# Patient Record
Sex: Male | Born: 1950 | Race: Black or African American | Hispanic: No | Smoking: Never smoker
Health system: Southern US, Community
[De-identification: ages and names within clinical notes are randomized; demographics above are authoritative.]

## PROBLEM LIST (undated history)

## (undated) DIAGNOSIS — T7840XA Allergy, unspecified, initial encounter: Secondary | ICD-10-CM

## (undated) DIAGNOSIS — I10 Essential (primary) hypertension: Secondary | ICD-10-CM

## (undated) DIAGNOSIS — M1990 Unspecified osteoarthritis, unspecified site: Secondary | ICD-10-CM

## (undated) HISTORY — DX: Essential (primary) hypertension: I10

## (undated) HISTORY — PX: COLONOSCOPY: SHX174

## (undated) HISTORY — DX: Allergy, unspecified, initial encounter: T78.40XA

## (undated) HISTORY — PX: TRIGGER FINGER RELEASE: SHX641

## (undated) HISTORY — PX: APPENDECTOMY: SHX54

## (undated) HISTORY — PX: COSMETIC SURGERY: SHX468

## (undated) HISTORY — PX: MINOR CARPAL TUNNEL: SHX6472

---

## 1972-10-20 HISTORY — PX: FACIAL RECONSTRUCTION SURGERY: SHX631

## 2006-12-17 ENCOUNTER — Ambulatory Visit: Payer: Self-pay | Admitting: Specialist

## 2010-11-22 ENCOUNTER — Emergency Department: Payer: Self-pay | Admitting: Emergency Medicine

## 2011-09-15 ENCOUNTER — Ambulatory Visit: Payer: Self-pay | Admitting: General Practice

## 2012-07-26 ENCOUNTER — Ambulatory Visit: Payer: Self-pay | Admitting: Gastroenterology

## 2014-04-06 ENCOUNTER — Inpatient Hospital Stay: Payer: Self-pay | Admitting: Surgery

## 2014-04-06 LAB — URINALYSIS, COMPLETE
BACTERIA: NONE SEEN
Bilirubin,UR: NEGATIVE
GLUCOSE, UR: NEGATIVE mg/dL (ref 0–75)
Hyaline Cast: 5
Leukocyte Esterase: NEGATIVE
Nitrite: NEGATIVE
Ph: 5 (ref 4.5–8.0)
SPECIFIC GRAVITY: 1.031 (ref 1.003–1.030)
Squamous Epithelial: NONE SEEN

## 2014-04-06 LAB — COMPREHENSIVE METABOLIC PANEL
AST: 34 U/L (ref 15–37)
Albumin: 4.7 g/dL (ref 3.4–5.0)
Alkaline Phosphatase: 97 U/L
Anion Gap: 7 (ref 7–16)
BILIRUBIN TOTAL: 1.7 mg/dL — AB (ref 0.2–1.0)
BUN: 19 mg/dL — ABNORMAL HIGH (ref 7–18)
CALCIUM: 9.5 mg/dL (ref 8.5–10.1)
CO2: 26 mmol/L (ref 21–32)
CREATININE: 1.37 mg/dL — AB (ref 0.60–1.30)
Chloride: 101 mmol/L (ref 98–107)
EGFR (Non-African Amer.): 55 — ABNORMAL LOW
GLUCOSE: 84 mg/dL (ref 65–99)
Osmolality: 270 (ref 275–301)
Potassium: 3.8 mmol/L (ref 3.5–5.1)
SGPT (ALT): 25 U/L (ref 12–78)
Sodium: 134 mmol/L — ABNORMAL LOW (ref 136–145)
TOTAL PROTEIN: 8.7 g/dL — AB (ref 6.4–8.2)

## 2014-04-06 LAB — CBC WITH DIFFERENTIAL/PLATELET
BASOS PCT: 0.5 %
Basophil #: 0.1 10*3/uL (ref 0.0–0.1)
Eosinophil #: 0 10*3/uL (ref 0.0–0.7)
Eosinophil %: 0.2 %
HCT: 47.7 % (ref 40.0–52.0)
HGB: 16.3 g/dL (ref 13.0–18.0)
LYMPHS ABS: 1.4 10*3/uL (ref 1.0–3.6)
Lymphocyte %: 8.4 %
MCH: 30.7 pg (ref 26.0–34.0)
MCHC: 34.1 g/dL (ref 32.0–36.0)
MCV: 90 fL (ref 80–100)
Monocyte #: 1.3 x10 3/mm — ABNORMAL HIGH (ref 0.2–1.0)
Monocyte %: 7.3 %
Neutrophil #: 14.4 10*3/uL — ABNORMAL HIGH (ref 1.4–6.5)
Neutrophil %: 83.6 %
Platelet: 244 10*3/uL (ref 150–440)
RBC: 5.31 10*6/uL (ref 4.40–5.90)
RDW: 13 % (ref 11.5–14.5)
WBC: 17.2 10*3/uL — ABNORMAL HIGH (ref 3.8–10.6)

## 2014-04-06 LAB — TROPONIN I

## 2014-04-06 LAB — LIPASE, BLOOD: LIPASE: 76 U/L (ref 73–393)

## 2014-04-10 LAB — PATHOLOGY REPORT

## 2015-02-10 NOTE — Op Note (Signed)
PATIENT NAME:  Nicholas Shah, Nicholas Shah MR#:  [id number] DATE OF BIRTH:  [birth year]  DATE OF PROCEDURE:  04/06/2014  PREOPERATIVE DIAGNOSIS: Acute appendicitis.   POSTOPERATIVE DIAGNOSIS: Acute appendicitis, gangrenous.  PROCEDURE PERFORMED:  Laparoscopic appendectomy.  ESTIMATED BLOOD LOSS: 10 mL.  COMPLICATIONS: None.   SPECIMEN: Appendix.   ANESTHESIA: General endotracheal.   INDICATION FOR SURGERY: Mr. [name] is a pleasant 64 year old who presented with right lower quadrant pain and diarrhea. Radiographic and laboratory findings consistent with appendicitis. She was brought to the operating room for laparoscopic appendectomy.   DETAILS OF PROCEDURE: Informed consent to was obtained. [name] brought to the operating room were anesthesia was induced and endotracheal tube was placed and all anesthesia was administered. His abdomen was then prepped and draped in standard surgical fashion.  A timeout was performed to make sure, during a period amount of time, patient name, operative site, and procedure to be performed. The supraumbilical incision made and it was deepened down to the fascia. The fascia inside of the perineum was entered. Two stay sutures were placed in the fasciotomy and Hassan trocar were placed in the abdomen.  The abdomen was insufflated and then two 5 mm trocars were placed in the left lower quadrant and suprapubic regions. Appendix was visualized in the right lower quadrant.  It had a large amount of fibrinous exudate and it appeared to be grossly gangrenous.  It was then grasped. A defect was placed in the mesoappendix, the base of the appendix. A stapler was placed across the base of the appendix, flush with the cecum. A stapler was then placed across the mesoappendix. The appendix was then taken out through an Endo Catch bag. The staple lines were examined and made hemostatic.  After complete of  hemostasis, all trocars were taken out under direct visualization. The  supraumbilical fascial defect was closed with figure-of-8 and 0 Vicryl, and 4-0 and 1-0 Monocryl deep dermal sutures were used to close the skin sites. Steri-Strips, Telfa pad, and Tegaderm were then used to complete the dressing. The patient was then awakened, extubated, and taken to the postanesthesia care unit. There were no immediate complications. Needle, sponge, and instrument counts were correct at the end of the procedure.     ____________________________ Si Raiderhristopher A. Lam Bjorklund, MD cal:ts D: 04/08/2014 15:04:10 ET T: 04/08/2014 19:17:04 ET JOB#: [id number]  cc: Cristal Deerhristopher A. Taron Conrey, MD, <Dictator> Nicholas NewcomerHRISTOPHER A Courtez Twaddle MD ELECTRONICALLY SIGNED 04/11/2014 11:00

## 2015-02-10 NOTE — Discharge Summary (Signed)
PATIENT NAME:  Nicholas Shah, Nicholas Shah MR#:  [id number] DATE OF BIRTH:  1951/01/01  DATE OF ADMISSION:  04/06/2014 DATE OF DISCHARGE:  04/07/2014  BRIEF HISTORY OF PRESENT ILLNESS:  Netty StarringSherman Harlin is a 64 year old gentleman seen in the Emergency Room with evidence for acute appendicitis.  He was seen by his primary care doctor for a 4 to 5 day history of abdominal pain.  Work-up suggested acute appendicitis which was confirmed on CT scan.  He is an otherwise healthy gentleman.  In this setting we elected to consider surgical intervention.  He was taken to surgery on the evening of 04/06/2014 where he underwent a laparoscopic appendectomy.  The procedure was uncomplicated without significant intraoperative problems.  Postoperatively, he was able to tolerate a regular diet within 18 hours.  This evening he is up, active and ambulating without difficulty.  He will be discharged home tonight.  He will follow in the office in 7 to 10 days' time.  Bathing, activity, and driving instructions were given to the patient.  He is to take Vicodin 5/325 by mouth for pain every 4 to 6 hours.   FINAL DISCHARGE DIAGNOSIS:  Acute appendicitis.   SURGERY:  Laparoscopic appendectomy.     ____________________________ Quentin Orealph L. Ely III, MD rle:ea D: 04/07/2014 17:56:28 ET T: 04/07/2014 23:21:01 ET JOB#: [id number]  cc: Carmie Endalph L. Ely III, MD, <Dictator> Steele SizerMark A. Crissman, MD Quentin OreALPH L ELY MD ELECTRONICALLY SIGNED 04/13/2014 16:24

## 2015-02-10 NOTE — Op Note (Signed)
PATIENT NAME:  Nicholas Shah, Saw C MR#:  [id number] DATE OF BIRTH:  07-18-1951  DATE OF PROCEDURE:  04/06/2014  PREOPERATIVE DIAGNOSIS: Acute appendicitis.   POSTOPERATIVE DIAGNOSIS: Acute appendicitis, gangrenous.  PROCEDURE PERFORMED:  Laparoscopic appendectomy.   ESTIMATED BLOOD LOSS: 10 mL.  COMPLICATIONS: None.   SPECIMEN: Appendix.   ANESTHESIA: General endotracheal.  INDICATION FOR SURGERY: [name] is a pleasant 64 year old who presents with right lower quadrant pain and diarrhea. Radiographic and laboratory findings consistent with appendicitis. She was brought to the operating room for laparoscopic appendectomy.   DETAILS OF PROCEDURE: Informed consent was obtained. [name] was brought to the operating room where anesthesia was induced.  An endotracheal tube was placed  and all anesthesia was administered .  The abdomen was then prepped and draped in standard surgical fashion.  A timeout was performed correctly identifying patient name, operative site and procedure to be performed. A supraumbilical incision was made and it was deepened down to the fascia.  The fascia at the site of the perineum was entered.  Two stay sutures were placed in the fasciotomy and Hassan trocar was placed in the abdomen.  The abdomen was insufflated and two 5 mm  trocars were placed in the left lower quadrant and suprapubic regions. The appendix was visualized in the right lower quadrant.  It had a large amount fibrinous exudate and it appeared to be grossly gangrenous.  It was then grasped. A defect was placed in the mesoappendix. In the base of the appendix, a stapler was placed across the base of the appendix flush with the cecum. A stapler was then placed across the mesoappendix. The appendix was then taken out through an Endo Catch bag. The staple lines were examined and remained hemostatic.  After completed hemostasis, all trocars were taken out under direct visualization. The supraumbilical fascial  defect was closed with a figure-of-8 and 0 Vicryl and 4-0 Monocryl deep dermal sutures were used to close the skin sites.  Steri-Strips, Telfa gauze, and Tegaderm were then used to complete the dressing. The patient was then awoken, extubated and brought to the postanesthesia care unit. There were no immediate complications. Needle, sponge, and instrument counts were correct at the end of the procedure.    ____________________________ Si Raiderhristopher A. Lundquist, MD cal:ts D: 04/08/2014 15:04:10 ET T: 04/08/2014 18:54:41 ET JOB#: [id number]  cc: Cristal Deerhristopher A. Lundquist, MD, <Dictator>  Jarvis NewcomerHRISTOPHER A LUNDQUIST MD ELECTRONICALLY SIGNED 04/11/2014 11:00

## 2016-02-15 ENCOUNTER — Encounter: Payer: Self-pay | Admitting: Physician Assistant

## 2016-02-15 ENCOUNTER — Ambulatory Visit
Admission: RE | Admit: 2016-02-15 | Discharge: 2016-02-15 | Disposition: A | Discharge status: Home / Self Care | Payer: Managed Care, Other (non HMO) | Source: Ambulatory Visit | Attending: Physician Assistant | Admitting: Physician Assistant

## 2016-02-15 ENCOUNTER — Ambulatory Visit: Payer: Self-pay | Admitting: Physician Assistant

## 2016-02-15 VITALS — BP 120/80 | HR 71 | Temp 98.4°F

## 2016-02-15 DIAGNOSIS — M1711 Unilateral primary osteoarthritis, right knee: Secondary | ICD-10-CM | POA: Insufficient documentation

## 2016-02-15 DIAGNOSIS — M25561 Pain in right knee: Secondary | ICD-10-CM

## 2016-02-15 DIAGNOSIS — M222X1 Patellofemoral disorders, right knee: Secondary | ICD-10-CM | POA: Insufficient documentation

## 2016-02-15 MED ORDER — MELOXICAM 7.5 MG PO TABS: 7.5000 mg | ORAL_TABLET | Freq: Every day | ORAL | Status: DC

## 2016-02-15 NOTE — Addendum Note (Signed)
Addended by: Joni ReiningSMITH, Yousra Ivens K on: 02/15/2016 10:26 AM   Modules accepted: Orders

## 2016-02-15 NOTE — Progress Notes (Signed)
   Subjective: Right knee pain    Patient ID: Nicholas JarvisSherman C Elford, male    DOB: 12/12/1950, 65 y.o.   MRN: [id number]  HPI Patient c/o right knee pain for 3 months. States pain after prolong sitting and gets better with ambulation. Intermitting mild edema. No provocative incident. Tibial fracture 20 years ago. No palliative measure for compliant.    Review of Systems    Negative Objective:   Physical Exam No obvious deformity or edema. Full and equal ROM. Moderate crepitus with palpation.       Assessment & Plan: Chronic right knee pain.  X-ray right knee, trial of Mobic for 10 days.  Follow up 3 days for re-evaluation.

## 2016-02-15 NOTE — Progress Notes (Signed)
Patient ID: Nicholas JarvisSherman C Shah, male   DOB: 1950-11-18, 65 y.o.   MRN: [id number] I spoke to the patient about his xray results and he expressed understanding.  He will follow-up as needed.

## 2016-02-18 ENCOUNTER — Ambulatory Visit: Payer: Self-pay | Admitting: Physician Assistant

## 2016-02-22 ENCOUNTER — Ambulatory Visit: Payer: Self-pay | Admitting: Physician Assistant

## 2016-02-22 ENCOUNTER — Encounter: Payer: Self-pay | Admitting: Physician Assistant

## 2016-02-22 VITALS — BP 150/90 | HR 79 | Temp 98.3°F

## 2016-02-22 DIAGNOSIS — M1990 Unspecified osteoarthritis, unspecified site: Secondary | ICD-10-CM

## 2016-02-22 MED ORDER — DICLOFENAC SODIUM 1 % TD GEL: 4.0000 g | Freq: Four times a day (QID) | TRANSDERMAL | Status: DC

## 2016-02-22 NOTE — Progress Notes (Signed)
S: c/o continued r knee pain, states his head is hurting and thinks its from the mobic, got so bad yesterday he had to stop on the side of the road and throw up, stopped the mobic about 3 days ago, headache has gotten a lot better this morning, noticed his bp is up, xray of r knee showed arthritis, no known injury, pain increased with movement, is affecting his golf game  O: vitals w elevated bp at 150/90; perrl eomi ENT wnl, neck supple no lymph, lungs c t a, cv rrr, r knee with crepitus on extension, tender at joint line, n/v intact, cn II-XII intact  A: arthritis, headache, elevated bp  P: stop mobic, use voltaren gel, otc tylenol arthritis, if headache continues and is worsening go to ER for eval, will refer to ortho for knee pain, recheck bp this weekend, if still elevated return to clinic on monday

## 2016-02-26 NOTE — Progress Notes (Signed)
Patient ID: Nicholas JarvisSherman C Shah, male   DOB: 01/21/1951, 65 y.o.   MRN: [id number] Patient was referred to see Altamese CabalMaurice Jones, PA-C at [facility] on 02/27/2016 at 3pm.  Patient has been notified.

## 2016-03-12 ENCOUNTER — Other Ambulatory Visit: Payer: Self-pay | Admitting: Specialist

## 2016-03-12 DIAGNOSIS — M1711 Unilateral primary osteoarthritis, right knee: Secondary | ICD-10-CM

## 2016-03-26 ENCOUNTER — Ambulatory Visit: Payer: Managed Care, Other (non HMO)

## 2016-04-07 ENCOUNTER — Other Ambulatory Visit: Payer: Self-pay | Admitting: Orthopedic Surgery

## 2016-04-10 ENCOUNTER — Encounter: Payer: Self-pay | Admitting: *Deleted

## 2016-04-10 ENCOUNTER — Other Ambulatory Visit: Payer: Self-pay

## 2016-04-10 NOTE — Patient Instructions (Signed)
  Your procedure is scheduled on: 04-17-16 [facility](THURSDAY) Report to Same Day Surgery 2nd floor medical mall To find out your arrival time please call [phone] between 1PM - 3PM on 04-16-16 [facility](WEDNESDAY)  Remember: Instructions that are not followed completely may result in serious medical risk, up to and including death, or upon the discretion of your surgeon and anesthesiologist your surgery may need to be rescheduled.    _x___ 1. Do not eat food or drink liquids after midnight. No gum chewing or hard candies.     __x__ 2. No Alcohol for 24 hours before or after surgery.   __x__3. No Smoking for 24 prior to surgery.   ____  4. Bring all medications with you on the day of surgery if instructed.    __x__ 5. Notify your doctor if there is any change in your medical condition     (cold, fever, infections).     Do not wear jewelry, make-up, hairpins, clips or nail polish.  Do not wear lotions, powders, or perfumes. You may wear deodorant.  Do not shave 48 hours prior to surgery. Men may shave face and neck.  Do not bring valuables to the hospital.    [facility] is not responsible for any belongings or valuables.               Contacts, dentures or bridgework may not be worn into surgery.  Leave your suitcase in the car. After surgery it may be brought to your room.  For patients admitted to the hospital, discharge time is determined by your treatment team.   Patients discharged the day of surgery will not be allowed to drive home.    Please read over the following fact sheets that you were given:   [facility] Preparing for Surgery and or MRSA Information   _x___ Take these medicines the morning of surgery with A SIP OF WATER:    1. NONE  2.  3.  4.  5.  6.  ____ Fleet Enema (as directed)   _x___ Use CHG Soap or sage wipes as directed on instruction sheet   ____ Use inhalers on the day of surgery and bring to hospital day of surgery  ____ Stop metformin 2 days prior to  surgery    ____ Take 1/2 of usual insulin dose the night before surgery and none on the morning of           surgery.   ____ Stop aspirin or coumadin, or plavix  _x__ Stop Anti-inflammatories such as Advil, Aleve, Ibuprofen, Motrin, Naproxen,          Naprosyn, Goodies powders or aspirin products. Ok to take Tylenol OR TRAMADOL IF NEEDED   ____ Stop supplements until after surgery.    ____ Bring C-Pap to the hospital.

## 2016-04-11 ENCOUNTER — Ambulatory Visit
Admission: RE | Admit: 2016-04-11 | Discharge: 2016-04-11 | Disposition: A | Discharge status: Home / Self Care | Payer: Managed Care, Other (non HMO) | Source: Ambulatory Visit | Attending: Orthopedic Surgery | Admitting: Orthopedic Surgery

## 2016-04-11 DIAGNOSIS — M25561 Pain in right knee: Secondary | ICD-10-CM | POA: Diagnosis present

## 2016-04-11 DIAGNOSIS — S83241D Other tear of medial meniscus, current injury, right knee, subsequent encounter: Secondary | ICD-10-CM | POA: Insufficient documentation

## 2016-04-11 DIAGNOSIS — X58XXXD Exposure to other specified factors, subsequent encounter: Secondary | ICD-10-CM | POA: Insufficient documentation

## 2016-04-11 DIAGNOSIS — Z01810 Encounter for preprocedural cardiovascular examination: Secondary | ICD-10-CM | POA: Diagnosis not present

## 2016-04-11 DIAGNOSIS — Z01812 Encounter for preprocedural laboratory examination: Secondary | ICD-10-CM | POA: Diagnosis present

## 2016-04-11 LAB — CBC WITH DIFFERENTIAL/PLATELET
BASOS PCT: 1 %
Basophils Absolute: 0 10*3/uL (ref 0–0.1)
EOS ABS: 0.2 10*3/uL (ref 0–0.7)
Eosinophils Relative: 4 %
HCT: 45.2 % (ref 40.0–52.0)
Hemoglobin: 15.3 g/dL (ref 13.0–18.0)
Lymphocytes Relative: 35 %
Lymphs Abs: 1.6 10*3/uL (ref 1.0–3.6)
MCH: 30.6 pg (ref 26.0–34.0)
MCHC: 34 g/dL (ref 32.0–36.0)
MCV: 90.1 fL (ref 80.0–100.0)
MONO ABS: 0.4 10*3/uL (ref 0.2–1.0)
MONOS PCT: 9 %
NEUTROS PCT: 51 %
Neutro Abs: 2.3 10*3/uL (ref 1.4–6.5)
Platelets: 168 10*3/uL (ref 150–440)
RBC: 5.01 MIL/uL (ref 4.40–5.90)
RDW: 13.4 % (ref 11.5–14.5)
WBC: 4.5 10*3/uL (ref 3.8–10.6)

## 2016-04-11 LAB — APTT: APTT: 29 s (ref 24–36)

## 2016-04-11 LAB — BASIC METABOLIC PANEL
Anion gap: 7 (ref 5–15)
BUN: 18 mg/dL (ref 6–20)
CALCIUM: 9.7 mg/dL (ref 8.9–10.3)
CO2: 28 mmol/L (ref 22–32)
CREATININE: 1.35 mg/dL — AB (ref 0.61–1.24)
Chloride: 105 mmol/L (ref 101–111)
GFR calc non Af Amer: 54 mL/min — ABNORMAL LOW (ref >60)
GLUCOSE: 99 mg/dL (ref 65–99)
Potassium: 4.2 mmol/L (ref 3.5–5.1)
Sodium: 140 mmol/L (ref 135–145)

## 2016-04-11 LAB — PROTIME-INR
INR: 0.99
Prothrombin Time: 13.3 seconds (ref 11.4–15.0)

## 2016-04-17 ENCOUNTER — Ambulatory Visit: Payer: Managed Care, Other (non HMO) | Admitting: Anesthesiology

## 2016-04-17 ENCOUNTER — Encounter
Admission: RE | Disposition: A | Discharge status: Home / Self Care | Payer: Self-pay | Source: Ambulatory Visit | Attending: Orthopedic Surgery

## 2016-04-17 ENCOUNTER — Ambulatory Visit
Admission: RE | Admit: 2016-04-17 | Discharge: 2016-04-17 | Disposition: A | Discharge status: Home / Self Care | Payer: Managed Care, Other (non HMO) | Source: Ambulatory Visit | Attending: Orthopedic Surgery | Admitting: Orthopedic Surgery

## 2016-04-17 ENCOUNTER — Encounter: Payer: Self-pay | Admitting: *Deleted

## 2016-04-17 DIAGNOSIS — M23221 Derangement of posterior horn of medial meniscus due to old tear or injury, right knee: Secondary | ICD-10-CM | POA: Diagnosis not present

## 2016-04-17 DIAGNOSIS — M23261 Derangement of other lateral meniscus due to old tear or injury, right knee: Secondary | ICD-10-CM | POA: Insufficient documentation

## 2016-04-17 DIAGNOSIS — Z79899 Other long term (current) drug therapy: Secondary | ICD-10-CM | POA: Diagnosis not present

## 2016-04-17 DIAGNOSIS — M1990 Unspecified osteoarthritis, unspecified site: Secondary | ICD-10-CM | POA: Diagnosis not present

## 2016-04-17 HISTORY — DX: Unspecified osteoarthritis, unspecified site: M19.90

## 2016-04-17 HISTORY — PX: KNEE ARTHROSCOPY: SHX127

## 2016-04-17 SURGERY — ARTHROSCOPY, KNEE
Anesthesia: General | Site: Knee | Laterality: Right | Wound class: Clean

## 2016-04-17 MED ORDER — GLYCOPYRROLATE 0.2 MG/ML IJ SOLN
INTRAMUSCULAR | Status: DC | PRN
  Administered 2016-04-17: 0.2 mg via INTRAVENOUS

## 2016-04-17 MED ORDER — CHLORHEXIDINE GLUCONATE 4 % EX LIQD: 60.0000 mL | Freq: Once | CUTANEOUS | Status: DC

## 2016-04-17 MED ORDER — PROPOFOL 10 MG/ML IV BOLUS
INTRAVENOUS | Status: DC | PRN
  Administered 2016-04-17: 150 mg via INTRAVENOUS

## 2016-04-17 MED ORDER — HYDROMORPHONE HCL 1 MG/ML IJ SOLN
0.5000 mg | INTRAMUSCULAR | Status: DC | PRN
  Administered 2016-04-17 (×2): 0.5 mg via INTRAVENOUS

## 2016-04-17 MED ORDER — LACTATED RINGERS IV SOLN
INTRAVENOUS | Status: DC | PRN
  Administered 2016-04-17: 11:00:00 via INTRAVENOUS

## 2016-04-17 MED ORDER — PHENYLEPHRINE HCL 10 MG/ML IJ SOLN
INTRAMUSCULAR | Status: DC | PRN
  Administered 2016-04-17: 200 ug via INTRAVENOUS
  Administered 2016-04-17 (×6): 100 ug via INTRAVENOUS
  Administered 2016-04-17: 200 ug via INTRAVENOUS

## 2016-04-17 MED ORDER — HYDROCODONE-ACETAMINOPHEN 5-325 MG PO TABS
1.0000 | ORAL_TABLET | Freq: Four times a day (QID) | ORAL | Status: DC | PRN
  Administered 2016-04-17: 1 via ORAL

## 2016-04-17 MED ORDER — BUPIVACAINE-EPINEPHRINE 0.25% -1:200000 IJ SOLN
INTRAMUSCULAR | Status: DC | PRN
  Administered 2016-04-17: 30 mL

## 2016-04-17 MED ORDER — FAMOTIDINE 20 MG PO TABS
ORAL_TABLET | ORAL | Status: AC
  Filled 2016-04-17: qty 1

## 2016-04-17 MED ORDER — ONDANSETRON HCL 4 MG PO TABS: 4.0000 mg | ORAL_TABLET | Freq: Three times a day (TID) | ORAL | Status: DC | PRN

## 2016-04-17 MED ORDER — CEFAZOLIN SODIUM-DEXTROSE 2-4 GM/100ML-% IV SOLN
INTRAVENOUS | Status: AC
  Filled 2016-04-17: qty 100

## 2016-04-17 MED ORDER — EPHEDRINE SULFATE 50 MG/ML IJ SOLN
INTRAMUSCULAR | Status: DC | PRN
  Administered 2016-04-17: 10 mg via INTRAVENOUS

## 2016-04-17 MED ORDER — HYDROMORPHONE HCL 1 MG/ML IJ SOLN
INTRAMUSCULAR | Status: AC
  Administered 2016-04-17: 0.5 mg via INTRAVENOUS
  Filled 2016-04-17: qty 1

## 2016-04-17 MED ORDER — MIDAZOLAM HCL 2 MG/2ML IJ SOLN
INTRAMUSCULAR | Status: DC | PRN
  Administered 2016-04-17: 2 mg via INTRAVENOUS

## 2016-04-17 MED ORDER — BUPIVACAINE HCL (PF) 0.25 % IJ SOLN
INTRAMUSCULAR | Status: AC
  Filled 2016-04-17: qty 30

## 2016-04-17 MED ORDER — ONDANSETRON HCL 4 MG/2ML IJ SOLN
INTRAMUSCULAR | Status: DC | PRN
  Administered 2016-04-17: 4 mg via INTRAVENOUS

## 2016-04-17 MED ORDER — FAMOTIDINE 20 MG PO TABS
20.0000 mg | ORAL_TABLET | Freq: Once | ORAL | Status: AC
  Administered 2016-04-17: 20 mg via ORAL

## 2016-04-17 MED ORDER — LIDOCAINE HCL (CARDIAC) 20 MG/ML IV SOLN
INTRAVENOUS | Status: DC | PRN
  Administered 2016-04-17: 80 mg via INTRAVENOUS

## 2016-04-17 MED ORDER — CEFAZOLIN SODIUM-DEXTROSE 2-4 GM/100ML-% IV SOLN
2.0000 g | INTRAVENOUS | Status: AC
  Administered 2016-04-17: 2 g via INTRAVENOUS

## 2016-04-17 MED ORDER — FENTANYL CITRATE (PF) 100 MCG/2ML IJ SOLN
INTRAMUSCULAR | Status: DC | PRN
  Administered 2016-04-17 (×3): 50 ug via INTRAVENOUS

## 2016-04-17 MED ORDER — LIDOCAINE HCL 1 % IJ SOLN
INTRAMUSCULAR | Status: DC | PRN
  Administered 2016-04-17: 10 mL

## 2016-04-17 MED ORDER — ONDANSETRON HCL 4 MG/2ML IJ SOLN: 4.0000 mg | Freq: Once | INTRAMUSCULAR | Status: DC | PRN

## 2016-04-17 MED ORDER — LIDOCAINE HCL (PF) 1 % IJ SOLN
INTRAMUSCULAR | Status: AC
Start: 2016-04-17 — End: 2016-04-17
  Filled 2016-04-17: qty 30

## 2016-04-17 MED ORDER — FENTANYL CITRATE (PF) 100 MCG/2ML IJ SOLN
25.0000 ug | INTRAMUSCULAR | Status: DC | PRN
  Administered 2016-04-17 (×4): 25 ug via INTRAVENOUS

## 2016-04-17 MED ORDER — HYDROCODONE-ACETAMINOPHEN 5-325 MG PO TABS: 1.0000 | ORAL_TABLET | Freq: Four times a day (QID) | ORAL | Status: DC | PRN

## 2016-04-17 MED ORDER — ASPIRIN EC 325 MG PO TBEC: 325.0000 mg | DELAYED_RELEASE_TABLET | Freq: Every day | ORAL | Status: DC

## 2016-04-17 MED ORDER — LACTATED RINGERS IV SOLN
INTRAVENOUS | Status: DC
  Administered 2016-04-17: 11:00:00 via INTRAVENOUS

## 2016-04-17 MED ORDER — HYDROCODONE-ACETAMINOPHEN 5-325 MG PO TABS
ORAL_TABLET | ORAL | Status: AC
  Filled 2016-04-17: qty 1

## 2016-04-17 MED ORDER — FENTANYL CITRATE (PF) 100 MCG/2ML IJ SOLN
INTRAMUSCULAR | Status: AC
  Administered 2016-04-17: 25 ug via INTRAVENOUS
  Filled 2016-04-17: qty 2

## 2016-04-17 SURGICAL SUPPLY — 34 items
BUR RADIUS 3.5 (BURR) ×2 IMPLANT
BUR RADIUS 4.0X18.5 (BURR) ×2 IMPLANT
COOLER POLAR GLACIER W/PUMP (MISCELLANEOUS) IMPLANT
CUFF TOURN 24 STER (MISCELLANEOUS) ×2 IMPLANT
CUFF TOURN 30 STER DUAL PORT (MISCELLANEOUS) IMPLANT
DRAPE IMP U-DRAPE 54X76 (DRAPES) ×2 IMPLANT
DURAPREP 26ML APPLICATOR (WOUND CARE) ×6 IMPLANT
GAUZE PETRO XEROFOAM 1X8 (MISCELLANEOUS) ×2 IMPLANT
GAUZE SPONGE 4X4 12PLY STRL (GAUZE/BANDAGES/DRESSINGS) ×2 IMPLANT
GLOVE BIOGEL PI IND STRL 9 (GLOVE) ×1 IMPLANT
GLOVE BIOGEL PI INDICATOR 9 (GLOVE) ×1
GLOVE SURG 9.0 ORTHO LTXF (GLOVE) ×4 IMPLANT
GOWN STRL REUS TWL 2XL XL LVL4 (GOWN DISPOSABLE) ×2 IMPLANT
GOWN STRL REUS W/ TWL LRG LVL3 (GOWN DISPOSABLE) ×1 IMPLANT
GOWN STRL REUS W/TWL LRG LVL3 (GOWN DISPOSABLE) ×1
IV LACTATED RINGER IRRG 3000ML (IV SOLUTION) ×4
IV LR IRRIG 3000ML ARTHROMATIC (IV SOLUTION) ×4 IMPLANT
KIT RM TURNOVER STRD PROC AR (KITS) ×2 IMPLANT
MANIFOLD NEPTUNE II (INSTRUMENTS) ×2 IMPLANT
PACK ARTHROSCOPY KNEE (MISCELLANEOUS) ×2 IMPLANT
PAD ABD DERMACEA PRESS 5X9 (GAUZE/BANDAGES/DRESSINGS) ×4 IMPLANT
PAD WRAPON POLAR KNEE (MISCELLANEOUS) ×1 IMPLANT
SET TUBE SUCT SHAVER OUTFL 24K (TUBING) ×2 IMPLANT
SET TUBE TIP INTRA-ARTICULAR (MISCELLANEOUS) ×2 IMPLANT
SOL PREP PVP 2OZ (MISCELLANEOUS)
SOLUTION PREP PVP 2OZ (MISCELLANEOUS) IMPLANT
STRIP CLOSURE SKIN 1/2X4 (GAUZE/BANDAGES/DRESSINGS) ×2 IMPLANT
SUT ETHILON 4-0 (SUTURE) ×1
SUT ETHILON 4-0 FS2 18XMFL BLK (SUTURE) ×1
SUTURE ETHLN 4-0 FS2 18XMF BLK (SUTURE) ×1 IMPLANT
TUBING ARTHRO INFLOW-ONLY STRL (TUBING) ×2 IMPLANT
WAND HAND CNTRL MULTIVAC 50 (MISCELLANEOUS) IMPLANT
WAND HAND CNTRL MULTIVAC 90 (MISCELLANEOUS) ×2 IMPLANT
WRAPON POLAR PAD KNEE (MISCELLANEOUS) ×2

## 2016-04-17 NOTE — Op Note (Signed)
PATIENT:  Nicholas Shah  PRE-OPERATIVE DIAGNOSIS:  TEAR OF MEDIAL MENISCUS  POST-OPERATIVE DIAGNOSIS:  Same  PROCEDURE:  KNEE ARTHROSCOPY WITH  Partial MEDIAL and LATERAL MENISECTOMY, synovectomy, chondroplasty of the femoral trochlea  SURGEON:  Thornton Park, MD  ANESTHESIA:   General  PREOPERATIVE INDICATIONS:  Nicholas Shah  65 y.o. male with a diagnosis of TEARS OF MEDIAL AND LATERAL MENISCUS who failed conservative management and elected for surgical management.  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  The risks benefits and alternatives were discussed with the patient preoperatively including the risks of infection, bleeding, nerve injury, knee stiffness, persistent pain, osteoarthritis and the need for further surgery. Medical  risks include DVT and pulmonary embolism, myocardial infarction, stroke, pneumonia, respiratory failure and death. The patient understood these risks and wished to proceed.   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  OPERATIVE PROCEDURE: Patient was met in the preoperative area. The right extremity was signed with the word yes and my initials according the hospital's correct site of surgery protocol.  The patient was brought to the operating room where he was placed supine on the operative table. General anesthesia was administered. The patient was prepped and draped in a sterile fashion.  A timeout was performed to verify the patient's name, date of birth, medical record number, correct site of surgery correct procedure to be performed. It was also used to verify the patient had had received antibiotics that all appropriate instruments, and radiographic studies were  available in the room. Once all in attendance were in agreement, the case began.  Proposed arthroscopy incisions were drawn out with a surgical marker. These were pre-injected with 1% lidocaine plain. An 11 blade was used to establish an inferior lateral and inferomedial portals. The inferomedial portal was created using a 18-gauge spinal needle under direct visualization.  A full diagnostic examination of the knee was performed including the suprapatellar pouch, patellofemoral joint, medial lateral compartments as well as the medial lateral gutters, the intercondylar notch in the posterior knee.  A partial synovectomy was performed using a 4.0 resector shaver blade and 90 ArthroCare wand from the anterior knee and suprapatella pouch and Hoffa's fat pad debrided. Patient had a tear of the posterior horn of the medial meniscus. The meniscal tear treated with a 4.0 and 3.5 resector shaver blades and straight and upward angulated duckbill biters. The meniscus was debrided until a stable rim was achieved. A chondroplasty of the femoral trochlea was also performed using a 4-0 resector shaver blade.  The patient's leg was then placed in a figure 4 position. Degenerative tears of the lateral meniscus were seen along the inner margin and debrided with a straight duckbill biter and 4.0 resector shaver blade.  The knee was then copiously lavaged. All arthroscopic incisions removed. The 2 arthroscopy portals were closed with 4-0 nylon. Steri-Strips were applied along with a dry sterile and compressive dressing. The patient was brought to the PACU in stable condition. I scrubbed and present the entire case and all sharp and instrument counts were correct at the conclusion the case. I spoke to the patient's family postoperatively to let them know the case was performed without complication and the patient was stable in the recovery room.   Nicholas Shah  L. Mack Guise, MD

## 2016-04-17 NOTE — Anesthesia Procedure Notes (Signed)
Procedure Name: LMA Insertion Performed by: Casey BurkittHOANG, Calob Baskette Pre-anesthesia Checklist: Patient identified, Timeout performed, Emergency Drugs available, Suction available and Patient being monitored Patient Re-evaluated:Patient Re-evaluated prior to inductionOxygen Delivery Method: Circle system utilized Preoxygenation: Pre-oxygenation with 100% oxygen Intubation Type: IV induction LMA: LMA inserted LMA Size: 4.0 Number of attempts: 1 Tube secured with: Tape Dental Injury: Teeth and Oropharynx as per pre-operative assessment

## 2016-04-17 NOTE — H&P (Signed)
The patient has been re-examined, and the chart reviewed, and there have been no interval changes to the documented history and physical.    The risks, benefits, and alternatives have been discussed at length, and the patient is willing to proceed.   

## 2016-04-17 NOTE — H&P (Signed)
PREOPERATIVE H&P  Chief Complaint: [id number]: Peripheral tear of medial meniscus  HPI: Nicholas Shah is a 65 y.o. male who presents for preoperative history and physical with a diagnosis of medial and possible lateral meniscus tears. Symptoms are rated as moderate to severe, and have been worsening.  This is significantly impairing activities of daily living including ambulation.  He has elected for surgical management.   Past Medical History  Diagnosis Date  . Arthritis    Past Surgical History  Procedure Laterality Date  . Appendectomy    . Minor carpal tunnel    . Trigger finger release    . Colonoscopy     Social History   Social History  . Marital Status: Married    Spouse Name: N/A  . Number of Children: N/A  . Years of Education: N/A   Social History Main Topics  . Smoking status: Never Smoker   . Smokeless tobacco: None  . Alcohol Use: No  . Drug Use: No  . Sexual Activity: Not Asked   Other Topics Concern  . None   Social History Narrative   History reviewed. No pertinent family history. No Known Allergies Prior to Admission medications   Medication Sig Start Date End Date Taking? Authorizing Provider  traMADol (ULTRAM) 50 MG tablet Take 50 mg by mouth every 6 (six) hours as needed.   Yes Historical Provider, MD  diclofenac sodium (VOLTAREN) 1 % GEL Apply 4 g topically 4 (four) times daily. Patient not taking: Reported on 04/03/2016 02/22/16   Faythe GheeSusan W Fisher, PA-C     Positive ROS: All other systems have been reviewed and were otherwise negative with the exception of those mentioned in the HPI and as above.  Physical Exam: General: Alert, no acute distress Cardiovascular: Regular rate and rhythm, no murmurs rubs or gallops.  No pedal edema Respiratory: Clear to auscultation bilaterally, no wheezes rales or rhonchi. No cyanosis, no use of accessory musculature GI: No organomegaly, abdomen is soft and non-tender nondistended with positive bowel  sounds. Skin: Skin intact, no lesions within the operative field. Neurologic: Sensation intact distally Psychiatric: Patient is competent for consent with normal mood and affect Lymphatic: No cervical lymphadenopathy  MUSCULOSKELETAL: Right knee:  Patient's skin is intact. There is no erythema or ecchymosis or effusion. His range of motion is from 0-130. He had tenderness over the medial greater than lateral joint lines. There is no ligamentous laxity. Hip pain with McMurray's testing. Patient 5 out of 5 strength in all muscle groups, intact sensation light touch in palpable pedal pulses. He has patellofemoral crepitus but no grind or apprehension.  Assessment: Right knee medial meniscus and possible lateral meniscus tears  Plan: Plan for Procedure(s): RIGHT KNEE ARTHROSCOPIC PARTIAL MEDIAL AND POSSIBLE LATERAL MENISCECTOMIES  I discussed the details of the operation as well as postoperative course with the patient in my office prior to the date of surgery.  I also discussed the risks and benefits of surgery. The risks include but are not limited to infection, bleeding requiring blood transfusion, nerve or blood vessel injury, joint stiffness or loss of motion, persistent pain, weakness or instability, malunion, nonunion and hardware failure and the need for further surgery. Medical risks include but are not limited to DVT and pulmonary embolism, myocardial infarction, stroke, pneumonia, respiratory failure and death. Patient understood these risks and wished to proceed. I answered all questions by the patient.  Juanell FairlyKRASINSKI, Erby Sanderson, MD   04/17/2016 7:34 AM

## 2016-04-17 NOTE — Transfer of Care (Signed)
Immediate Anesthesia Transfer of Care Note  Patient: Nicholas Shah  Procedure(s) Performed: Procedure(s): ARTHROSCOPY KNEE, MEDIAL AND LATERAL PARTIAL MENISECTOMIES (Right)  Patient Location: PACU  Anesthesia Type:General  Level of Consciousness: sedated and responds to stimulation  Airway & Oxygen Therapy: Patient Spontanous Breathing and Patient connected to face mask oxygen  Post-op Assessment: Report given to RN and Post -op Vital signs reviewed and stable  Post vital signs: Reviewed and stable  Last Vitals:  Filed Vitals:   04/17/16 1033 04/17/16 1221  BP: 155/96 145/95  Pulse: 71 85  Temp: 36.7 C 36.2 C  Resp: 16 16    Last Pain:  Filed Vitals:   04/17/16 1221  PainSc: 2          Complications: No apparent anesthesia complications

## 2016-04-17 NOTE — Anesthesia Postprocedure Evaluation (Signed)
Anesthesia Post Note  Patient: Nicholas JarvisSherman C Koppel  Procedure(s) Performed: Procedure(s) (LRB): ARTHROSCOPY KNEE, MEDIAL AND LATERAL PARTIAL MENISECTOMIES (Right)  Patient location during evaluation: PACU Anesthesia Type: General Level of consciousness: awake and alert Pain management: pain level controlled Vital Signs Assessment: post-procedure vital signs reviewed and stable Respiratory status: spontaneous breathing and respiratory function stable Cardiovascular status: stable Anesthetic complications: no    Last Vitals:  Filed Vitals:   04/17/16 1341 04/17/16 1358  BP: 140/86 164/85  Pulse: 95 85  Temp: 36.6 C   Resp: 14 16    Last Pain:  Filed Vitals:   04/17/16 1400  PainSc: 2                  Orange Hilligoss K

## 2016-04-17 NOTE — Anesthesia Preprocedure Evaluation (Signed)
Anesthesia Evaluation  Patient identified by MRN, date of birth, ID band Patient awake    Reviewed: Allergy & Precautions, NPO status , Patient's Chart, lab work & pertinent test results  History of Anesthesia Complications Negative for: history of anesthetic complications  Airway Mallampati: II       Dental  (+) Partial Upper   Pulmonary neg pulmonary ROS,           Cardiovascular negative cardio ROS       Neuro/Psych negative neurological ROS     GI/Hepatic negative GI ROS, Neg liver ROS,   Endo/Other  negative endocrine ROS  Renal/GU negative Renal ROS     Musculoskeletal  (+) Arthritis , Osteoarthritis,    Abdominal   Peds  Hematology negative hematology ROS (+)   Anesthesia Other Findings   Reproductive/Obstetrics                             Anesthesia Physical Anesthesia Plan  ASA: II  Anesthesia Plan: General   Post-op Pain Management:    Induction:   Airway Management Planned:   Additional Equipment:   Intra-op Plan:   Post-operative Plan:   Informed Consent: I have reviewed the patients History and Physical, chart, labs and discussed the procedure including the risks, benefits and alternatives for the proposed anesthesia with the patient or authorized representative who has indicated his/her understanding and acceptance.     Plan Discussed with:   Anesthesia Plan Comments:         Anesthesia Quick Evaluation

## 2016-04-17 NOTE — Discharge Instructions (Signed)

## 2016-06-03 ENCOUNTER — Ambulatory Visit: Payer: Self-pay | Admitting: Physician Assistant

## 2016-06-03 VITALS — BP 135/85 | HR 90 | Temp 98.3°F

## 2016-06-03 DIAGNOSIS — H1013 Acute atopic conjunctivitis, bilateral: Secondary | ICD-10-CM

## 2016-06-03 MED ORDER — OLOPATADINE HCL 0.1 % OP SOLN: 1.0000 [drp] | Freq: Two times a day (BID) | OPHTHALMIC | 12 refills | Status: DC

## 2016-06-03 NOTE — Progress Notes (Signed)
S: c/o eyes being matted in the mornings for last 3 months, no cold sx, no drainage or itching during the day, no fever/chills  O: vitals wnl, nad, perrl eomi, conjunctiva wnl, neck supple no lymph, lungsc  t a, cv rrr  A: allergic conjunctivitis  P: patanol opth gtts, lubricating drops prior to bed, return if not better in 1 week

## 2017-01-16 ENCOUNTER — Encounter (INDEPENDENT_AMBULATORY_CARE_PROVIDER_SITE_OTHER): Payer: Self-pay

## 2017-01-16 ENCOUNTER — Encounter: Payer: Self-pay | Admitting: Physician Assistant

## 2017-01-16 ENCOUNTER — Ambulatory Visit: Payer: Self-pay | Admitting: Physician Assistant

## 2017-01-16 VITALS — BP 130/80 | HR 89 | Temp 98.5°F | Ht 67.0 in | Wt 172.0 lb

## 2017-01-16 DIAGNOSIS — Z0000 Encounter for general adult medical examination without abnormal findings: Secondary | ICD-10-CM

## 2017-01-16 DIAGNOSIS — Z0189 Encounter for other specified special examinations: Secondary | ICD-10-CM

## 2017-01-16 DIAGNOSIS — Z008 Encounter for other general examination: Secondary | ICD-10-CM

## 2017-01-16 NOTE — Progress Notes (Signed)
S: pt here for wellness physical and biometrics for insurance purposes, no complaints ros neg. PMH:   High cholesterol treated with diet; knee surgery,  Social: nonsmoker, no etoh, no drugs  Fam:  CAD on father's side, some DM, most died of old age  O: vitals wnl, nad, ENT wnl, neck supple no lymph, lungs c t a, cv rrr, abd soft nontender bs normal all 4 quads  A: wellness, biometric physical  P: labs today, f/u prn

## 2017-01-22 LAB — CMP12+LP+TP+TSH+6AC+PSA+CBC…
ALBUMIN: 4.5 g/dL (ref 3.6–4.8)
ALT: 16 IU/L (ref 0–44)
AST: 12 IU/L (ref 0–40)
Albumin/Globulin Ratio: 1.8 (ref 1.2–2.2)
Alkaline Phosphatase: 73 IU/L (ref 39–117)
BASOS: 3 %
BUN/Creatinine Ratio: 13 (ref 10–24)
BUN: 19 mg/dL (ref 8–27)
Basophils Absolute: 0.2 10*3/uL (ref 0.0–0.2)
Bilirubin Total: 0.8 mg/dL (ref 0.0–1.2)
CALCIUM: 9.9 mg/dL (ref 8.6–10.2)
CHLORIDE: 103 mmol/L (ref 96–106)
CHOL/HDL RATIO: 4 ratio (ref 0.0–5.0)
Cholesterol, Total: 260 mg/dL — ABNORMAL HIGH (ref 100–199)
Creatinine, Ser: 1.43 mg/dL — ABNORMAL HIGH (ref 0.76–1.27)
EOS (ABSOLUTE): 0.5 10*3/uL — ABNORMAL HIGH (ref 0.0–0.4)
EOS: 8 %
Estimated CHD Risk: 0.7 times avg. (ref 0.0–1.0)
Free Thyroxine Index: 1.8 (ref 1.2–4.9)
GFR calc non Af Amer: 51 mL/min/{1.73_m2} — ABNORMAL LOW (ref >59)
GFR, EST AFRICAN AMERICAN: 59 mL/min/{1.73_m2} — AB (ref >59)
GGT: 27 IU/L (ref 0–65)
Globulin, Total: 2.5 g/dL (ref 1.5–4.5)
Glucose: 153 mg/dL — ABNORMAL HIGH (ref 65–99)
HDL: 65 mg/dL (ref >39)
HEMATOCRIT: 46.3 % (ref 37.5–51.0)
HEMOGLOBIN: 15.8 g/dL (ref 13.0–17.7)
Iron: 133 ug/dL (ref 38–169)
LDH: 171 IU/L (ref 121–224)
LDL CALC: 167 mg/dL — AB (ref 0–99)
Lymphocytes Absolute: 3.5 10*3/uL — ABNORMAL HIGH (ref 0.7–3.1)
Lymphs: 60 %
MCH: 30.3 pg (ref 26.6–33.0)
MCHC: 34.1 g/dL (ref 31.5–35.7)
MCV: 89 fL (ref 79–97)
MONOCYTES: 11 %
Monocytes Absolute: 0.6 10*3/uL (ref 0.1–0.9)
NEUTROS PCT: 18 %
Neutrophils Absolute: 1.1 10*3/uL — ABNORMAL LOW (ref 1.4–7.0)
POTASSIUM: 5 mmol/L (ref 3.5–5.2)
PROSTATE SPECIFIC AG, SERUM: 4.8 ng/mL — AB (ref 0.0–4.0)
Phosphorus: 2.9 mg/dL (ref 2.5–4.5)
Platelets: 210 10*3/uL (ref 150–379)
RBC: 5.22 x10E6/uL (ref 4.14–5.80)
RDW: 13.8 % (ref 12.3–15.4)
Sodium: 144 mmol/L (ref 134–144)
T3 Uptake Ratio: 27 % (ref 24–39)
T4, Total: 6.8 ug/dL (ref 4.5–12.0)
TOTAL PROTEIN: 7 g/dL (ref 6.0–8.5)
TRIGLYCERIDES: 140 mg/dL (ref 0–149)
TSH: 0.846 u[IU]/mL (ref 0.450–4.500)
Uric Acid: 6.7 mg/dL (ref 3.7–8.6)
VLDL CHOLESTEROL CAL: 28 mg/dL (ref 5–40)
WBC: 5.9 10*3/uL (ref 3.4–10.8)

## 2017-01-22 LAB — VITAMIN D 25 HYDROXY (VIT D DEFICIENCY, FRACTURES): Vit D, 25-Hydroxy: 8.5 ng/mL — ABNORMAL LOW (ref 30.0–100.0)

## 2017-01-22 LAB — HIV ANTIBODY (ROUTINE TESTING W REFLEX): HIV Screen 4th Generation wRfx: NONREACTIVE

## 2017-01-22 LAB — HCV COMMENT:

## 2017-01-22 LAB — HEPATITIS C ANTIBODY (REFLEX): HCV Ab: <0.1 s/co ratio (ref 0.0–0.9)

## 2017-01-26 ENCOUNTER — Other Ambulatory Visit: Payer: Self-pay | Admitting: Physician Assistant

## 2017-01-26 MED ORDER — VITAMIN D (ERGOCALCIFEROL) 1.25 MG (50000 UNIT) PO CAPS: 50000.0000 [IU] | ORAL_CAPSULE | ORAL | 3 refills | Status: DC

## 2017-01-26 NOTE — Progress Notes (Signed)
vit

## 2017-02-13 LAB — HGB A1C W/O EAG: HEMOGLOBIN A1C: 5.6 % (ref 4.8–5.6)

## 2017-02-13 LAB — SPECIMEN STATUS REPORT

## 2017-12-04 ENCOUNTER — Ambulatory Visit: Payer: Self-pay | Admitting: Physician Assistant

## 2017-12-04 ENCOUNTER — Encounter: Payer: Self-pay | Admitting: Physician Assistant

## 2017-12-04 VITALS — BP 128/78 | HR 82 | Temp 98.0°F | Ht 68.0 in | Wt 169.0 lb

## 2017-12-04 DIAGNOSIS — Z299 Encounter for prophylactic measures, unspecified: Secondary | ICD-10-CM

## 2017-12-04 MED ORDER — PSEUDOEPH-BROMPHEN-DM 30-2-10 MG/5ML PO SYRP: 5.0000 mL | ORAL_SOLUTION | Freq: Four times a day (QID) | ORAL | 0 refills | Status: DC | PRN

## 2017-12-04 NOTE — Progress Notes (Addendum)
Was seen by [name] on 12/04/17  For some reason, the prescription for Brompheniramine-pseudoephedrine-DM syrup,( for occasional prn use) by [name], PA-C was not sent electronically.  Patient called back today regarding this, and I'm going to electronically [facility] [address]  [name] M.D. 12/07/2017   Disp Refills Start End   brompheniramine-pseudoephedrine-DM 30-2-10 MG/5ML syrup 120 mL 0 12/07/2017    Sig - Route: Take 5 mLs by mouth 3 (three) times daily as needed. - Oral   Sent to pharmacy as: brompheniramine-pseudoephedrine-DM 30-2-10 MG/5ML syrup   E-Prescribing Status: Receipt confirmed by pharmacy (12/07/2017 12:43 PM EST)      Subjective: Annual exam/URI.    Patient ID: Nicholas Shah, male    DOB: [birth year], 67 y.o.   MRN: [id number]  HPI Patient reports today with annual exam.  Patient was URI signs and symptoms.  No medications.   Review of Systems    Unremarkable except for complaint Objective:   Physical Exam HEENT remarkable for clear rhinorrhea and postnasal drainage.  Pharynx was nonerythematous.  Neck was supple without adenopathy.  Lungs are clear to auscultation heart is regular rate and rhythm.  Examination upper lower extremity shows no obvious deformity.  Patient full equal range of motion of upper and lower extremities.  Examination of the cervical and lumbar spine shows no deformity.  Patient had full neck range of motion cervical lumbar spine.  Cranial nerves II through XII grossly intact.       Assessment & Plan: Well exam with viral URI.  Patient will follow-up post labs.  Patient given a prescription for Bromfed-DM.

## 2017-12-05 LAB — CMP12+LP+TP+TSH+6AC+PSA+CBC…
A/G RATIO: 1.6 (ref 1.2–2.2)
ALBUMIN: 4.4 g/dL (ref 3.6–4.8)
ALK PHOS: 69 IU/L (ref 39–117)
ALT: 16 IU/L (ref 0–44)
AST: 18 IU/L (ref 0–40)
BASOS ABS: 0 10*3/uL (ref 0.0–0.2)
BILIRUBIN TOTAL: 0.7 mg/dL (ref 0.0–1.2)
BUN/Creatinine Ratio: 12 (ref 10–24)
BUN: 18 mg/dL (ref 8–27)
Basos: 1 %
CALCIUM: 9.7 mg/dL (ref 8.6–10.2)
CHOL/HDL RATIO: 3.1 ratio (ref 0.0–5.0)
CHOLESTEROL TOTAL: 226 mg/dL — AB (ref 100–199)
Chloride: 104 mmol/L (ref 96–106)
Creatinine, Ser: 1.47 mg/dL — ABNORMAL HIGH (ref 0.76–1.27)
EOS (ABSOLUTE): 0.4 10*3/uL (ref 0.0–0.4)
EOS: 6 %
Estimated CHD Risk: <0.5 times avg. (ref 0.0–1.0)
FREE THYROXINE INDEX: 1.7 (ref 1.2–4.9)
GFR calc Af Amer: 57 mL/min/{1.73_m2} — ABNORMAL LOW (ref >59)
GFR calc non Af Amer: 49 mL/min/{1.73_m2} — ABNORMAL LOW (ref >59)
GGT: 16 IU/L (ref 0–65)
Globulin, Total: 2.8 g/dL (ref 1.5–4.5)
Glucose: 80 mg/dL (ref 65–99)
HDL: 74 mg/dL (ref >39)
HEMOGLOBIN: 14.8 g/dL (ref 13.0–17.7)
Hematocrit: 44.1 % (ref 37.5–51.0)
IMMATURE GRANS (ABS): 0 10*3/uL (ref 0.0–0.1)
IMMATURE GRANULOCYTES: 0 %
Iron: 31 ug/dL — ABNORMAL LOW (ref 38–169)
LDH: 172 IU/L (ref 121–224)
LDL CALC: 136 mg/dL — AB (ref 0–99)
LYMPHS ABS: 1.4 10*3/uL (ref 0.7–3.1)
LYMPHS: 21 %
MCH: 30.8 pg (ref 26.6–33.0)
MCHC: 33.6 g/dL (ref 31.5–35.7)
MCV: 92 fL (ref 79–97)
MONOS ABS: 0.8 10*3/uL (ref 0.1–0.9)
Monocytes: 11 %
NEUTROS PCT: 61 %
Neutrophils Absolute: 4.1 10*3/uL (ref 1.4–7.0)
PLATELETS: 232 10*3/uL (ref 150–379)
PROSTATE SPECIFIC AG, SERUM: 3.7 ng/mL (ref 0.0–4.0)
Phosphorus: 3.3 mg/dL (ref 2.5–4.5)
Potassium: 5.2 mmol/L (ref 3.5–5.2)
RBC: 4.81 x10E6/uL (ref 4.14–5.80)
RDW: 13.4 % (ref 12.3–15.4)
Sodium: 145 mmol/L — ABNORMAL HIGH (ref 134–144)
T3 Uptake Ratio: 25 % (ref 24–39)
T4, Total: 6.8 ug/dL (ref 4.5–12.0)
TRIGLYCERIDES: 78 mg/dL (ref 0–149)
TSH: 0.604 u[IU]/mL (ref 0.450–4.500)
Total Protein: 7.2 g/dL (ref 6.0–8.5)
Uric Acid: 5.9 mg/dL (ref 3.7–8.6)
VLDL Cholesterol Cal: 16 mg/dL (ref 5–40)
WBC: 6.7 10*3/uL (ref 3.4–10.8)

## 2017-12-05 LAB — VITAMIN B12: Vitamin B-12: 226 pg/mL — ABNORMAL LOW (ref 232–1245)

## 2017-12-07 MED ORDER — PSEUDOEPH-BROMPHEN-DM 30-2-10 MG/5ML PO SYRP: 5.0000 mL | ORAL_SOLUTION | Freq: Three times a day (TID) | ORAL | 0 refills | Status: DC | PRN

## 2017-12-07 NOTE — Addendum Note (Signed)
Addended byLajean Manes: MASSEY, DAVID B on: 12/07/2017 12:50 PM   Modules accepted: Orders

## 2017-12-16 ENCOUNTER — Emergency Department: Payer: Worker's Compensation

## 2017-12-16 ENCOUNTER — Emergency Department
Admission: EM | Admit: 2017-12-16 | Discharge: 2017-12-16 | Disposition: A | Discharge status: Home / Self Care | Payer: Worker's Compensation | Attending: Emergency Medicine | Admitting: Emergency Medicine

## 2017-12-16 ENCOUNTER — Other Ambulatory Visit: Payer: Self-pay

## 2017-12-16 DIAGNOSIS — S8782XA Crushing injury of left lower leg, initial encounter: Secondary | ICD-10-CM

## 2017-12-16 DIAGNOSIS — Y990 Civilian activity done for income or pay: Secondary | ICD-10-CM | POA: Insufficient documentation

## 2017-12-16 DIAGNOSIS — Y929 Unspecified place or not applicable: Secondary | ICD-10-CM | POA: Insufficient documentation

## 2017-12-16 DIAGNOSIS — Y9389 Activity, other specified: Secondary | ICD-10-CM | POA: Insufficient documentation

## 2017-12-16 DIAGNOSIS — Z23 Encounter for immunization: Secondary | ICD-10-CM | POA: Insufficient documentation

## 2017-12-16 LAB — GLUCOSE, CAPILLARY: GLUCOSE-CAPILLARY: 91 mg/dL (ref 65–99)

## 2017-12-16 MED ORDER — OXYCODONE-ACETAMINOPHEN 5-325 MG PO TABS
2.0000 | ORAL_TABLET | Freq: Once | ORAL | Status: AC
  Administered 2017-12-16: 2 via ORAL
  Filled 2017-12-16: qty 2

## 2017-12-16 MED ORDER — TETANUS-DIPHTH-ACELL PERTUSSIS 5-2.5-18.5 LF-MCG/0.5 IM SUSP
0.5000 mL | Freq: Once | INTRAMUSCULAR | Status: AC
  Administered 2017-12-16: 0.5 mL via INTRAMUSCULAR
  Filled 2017-12-16: qty 0.5

## 2017-12-16 MED ORDER — OXYCODONE-ACETAMINOPHEN 5-325 MG PO TABS: 1.0000 | ORAL_TABLET | Freq: Four times a day (QID) | ORAL | 0 refills | Status: DC | PRN

## 2017-12-16 NOTE — ED Notes (Signed)
Employer is with the pt and states they do Not require any drug screening for worker comp.
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
## 2017-12-16 NOTE — Discharge Instructions (Signed)
[facility] if any continued problems or concerns.  Return to the emergency room immediately if any symptoms of increased pain, swelling, inability to feel your toes or feeling a cool extremity.  This information is included in with your acute compartment syndrome information.  Continue taking Percocet every 6 hours as needed for pain.  Do not drive while taking this medication.  Use crutches for walking.

## 2018-02-22 DIAGNOSIS — S8010XA Contusion of unspecified lower leg, initial encounter: Secondary | ICD-10-CM | POA: Insufficient documentation

## 2018-03-17 DIAGNOSIS — S83289A Other tear of lateral meniscus, current injury, unspecified knee, initial encounter: Secondary | ICD-10-CM | POA: Insufficient documentation

## 2018-05-04 ENCOUNTER — Other Ambulatory Visit: Payer: Self-pay

## 2018-05-04 ENCOUNTER — Encounter
Admission: RE | Admit: 2018-05-04 | Discharge: 2018-05-04 | Disposition: A | Discharge status: Home / Self Care | Payer: Medicare Other | Source: Ambulatory Visit | Attending: Urology | Admitting: Urology

## 2018-05-04 NOTE — Patient Instructions (Signed)
Your procedure is scheduled on: 05-11-18 TUESDAY Report to Same Day Surgery 2nd floor medical mall [facility] Entrance-take elevator on left to 2nd floor.  Check in with surgery information desk.) To find out your arrival time please call [phone] between 1PM - 3PM on 05-10-18 MONDAY  Remember: Instructions that are not followed completely may result in serious medical risk, up to and including death, or upon the discretion of your surgeon and anesthesiologist your surgery may need to be rescheduled.    _x___ 1. Do not eat food after midnight the night before your procedure. You may drink clear liquids up to 2 hours before you are scheduled to arrive at the hospital for your procedure.  Do not drink clear liquids within 2 hours of your scheduled arrival to the hospital.  Clear liquids include  --Water or Apple juice without pulp  --Clear carbohydrate beverage such as ClearFast or Gatorade  --Black Coffee or Clear Tea (No milk, no creamers, do not add anything to the coffee or Tea Type 1 and type 2 diabetics should only drink water.  No gum chewing or hard candies.     __x__ 2. No Alcohol for 24 hours before or after surgery.   __x__3. No Smoking or e-cigarettes for 24 prior to surgery.  Do not use any chewable tobacco products for at least 6 hour prior to surgery   ____  4. Bring all medications with you on the day of surgery if instructed.    __x__ 5. Notify your doctor if there is any change in your medical condition     (cold, fever, infections).    x___6. On the morning of surgery brush your teeth with toothpaste and water.  You may rinse your mouth with mouth wash if you wish.  Do not swallow any toothpaste or mouthwash.   Do not wear jewelry, make-up, hairpins, clips or nail polish.  Do not wear lotions, powders, or perfumes. You may wear deodorant.  Do not shave 48 hours prior to surgery. Men may shave face and neck.  Do not bring valuables to the hospital.    [facility] is not responsible for any belongings or valuables.               Contacts, dentures or bridgework may not be worn into surgery.  Leave your suitcase in the car. After surgery it may be brought to your room.  For patients admitted to the hospital, discharge time is determined by your treatment team.  _  Patients discharged the day of surgery will not be allowed to drive home.  You will need someone to drive you home and stay with you the night of your procedure.    Please read over the following fact sheets that you were given:   [facility] Preparing for Surgery   _x___ TAKE THE FOLLOWING MEDICATION THE MORNING OF YOUR SURGERY WITH A SMALL SIP OF WATER. These include:  1. FLOMAX (TAMSULOSIN)  2.  3.  4.  5.  6.  ____Fleets enema or Magnesium Citrate as directed.   ____ Use CHG Soap or sage wipes as directed on instruction sheet   ____ Use inhalers on the day of surgery and bring to hospital day of surgery  ____ Stop Metformin and Janumet 2 days prior to surgery.    ____ Take 1/2 of usual insulin dose the night before surgery and none on the morning     surgery.   ____ Follow recommendations from Cardiologist, Pulmonologist  or PCP regarding stopping Aspirin, Coumadin, Plavix ,Eliquis, Effient, or Pradaxa, and Pletal.  X____Stop Anti-inflammatories such as Advil, Aleve, Ibuprofen, Motrin, Naproxen, Naprosyn, Goodies powders or aspirin products NOW-OK to take Tylenol    _X___ Stop supplements until after surgery-STOP PROSTATE 2.4 SUPPLEMENT NOW-MAY RESUME AFTER SURGERY   ____ Bring C-Pap to the hospital.

## 2018-05-05 ENCOUNTER — Encounter
Admission: RE | Admit: 2018-05-05 | Discharge: 2018-05-05 | Disposition: A | Discharge status: Home / Self Care | Payer: Managed Care, Other (non HMO) | Source: Ambulatory Visit | Attending: Urology | Admitting: Urology

## 2018-05-05 DIAGNOSIS — Z01812 Encounter for preprocedural laboratory examination: Secondary | ICD-10-CM | POA: Insufficient documentation

## 2018-05-05 DIAGNOSIS — Z01818 Encounter for other preprocedural examination: Secondary | ICD-10-CM | POA: Diagnosis present

## 2018-05-05 LAB — BASIC METABOLIC PANEL
ANION GAP: 7 (ref 5–15)
BUN: 21 mg/dL (ref 8–23)
CALCIUM: 8.8 mg/dL — AB (ref 8.9–10.3)
CO2: 24 mmol/L (ref 22–32)
Chloride: 107 mmol/L (ref 98–111)
Creatinine, Ser: 1.55 mg/dL — ABNORMAL HIGH (ref 0.61–1.24)
GFR calc non Af Amer: 45 mL/min — ABNORMAL LOW (ref >60)
GFR, EST AFRICAN AMERICAN: 52 mL/min — AB (ref >60)
Glucose, Bld: 102 mg/dL — ABNORMAL HIGH (ref 70–99)
Potassium: 4 mmol/L (ref 3.5–5.1)
SODIUM: 138 mmol/L (ref 135–145)
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
## 2018-05-11 ENCOUNTER — Other Ambulatory Visit: Payer: Self-pay

## 2018-05-11 ENCOUNTER — Encounter
Admission: RE | Disposition: A | Discharge status: Home / Self Care | Payer: Self-pay | Source: Ambulatory Visit | Attending: Urology

## 2018-05-11 ENCOUNTER — Ambulatory Visit: Payer: Managed Care, Other (non HMO) | Admitting: Anesthesiology

## 2018-05-11 ENCOUNTER — Ambulatory Visit
Admission: RE | Admit: 2018-05-11 | Discharge: 2018-05-11 | Disposition: A | Discharge status: Home / Self Care | Payer: Managed Care, Other (non HMO) | Source: Ambulatory Visit | Attending: Urology | Admitting: Urology

## 2018-05-11 ENCOUNTER — Encounter: Payer: Self-pay | Admitting: *Deleted

## 2018-05-11 DIAGNOSIS — N401 Enlarged prostate with lower urinary tract symptoms: Secondary | ICD-10-CM | POA: Insufficient documentation

## 2018-05-11 DIAGNOSIS — N138 Other obstructive and reflux uropathy: Secondary | ICD-10-CM | POA: Diagnosis not present

## 2018-05-11 HISTORY — PX: CYSTOSCOPY WITH INSERTION OF UROLIFT: SHX6678
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
## 2018-05-11 SURGERY — CYSTOSCOPY WITH INSERTION OF UROLIFT
Anesthesia: General | Site: Prostate | Wound class: "Clean Contaminated "

## 2018-05-11 MED ORDER — PROPOFOL 10 MG/ML IV BOLUS
INTRAVENOUS | Status: AC
  Filled 2018-05-11: qty 20

## 2018-05-11 MED ORDER — PHENYLEPHRINE HCL 10 MG/ML IJ SOLN
INTRAMUSCULAR | Status: DC | PRN
  Administered 2018-05-11 (×2): 100 ug via INTRAVENOUS

## 2018-05-11 MED ORDER — CEFAZOLIN SODIUM-DEXTROSE 1-4 GM/50ML-% IV SOLN
INTRAVENOUS | Status: AC
  Filled 2018-05-11: qty 50

## 2018-05-11 MED ORDER — EPHEDRINE SULFATE 50 MG/ML IJ SOLN
INTRAMUSCULAR | Status: AC
  Filled 2018-05-11: qty 1

## 2018-05-11 MED ORDER — FAMOTIDINE 20 MG PO TABS: 20.0000 mg | ORAL_TABLET | Freq: Once | ORAL | Status: DC

## 2018-05-11 MED ORDER — DEXAMETHASONE SODIUM PHOSPHATE 10 MG/ML IJ SOLN
INTRAMUSCULAR | Status: DC | PRN
  Administered 2018-05-11: 10 mg via INTRAVENOUS

## 2018-05-11 MED ORDER — DEXAMETHASONE SODIUM PHOSPHATE 10 MG/ML IJ SOLN
INTRAMUSCULAR | Status: AC
Start: 2018-05-11 — End: ?
  Filled 2018-05-11: qty 1

## 2018-05-11 MED ORDER — FENTANYL CITRATE (PF) 100 MCG/2ML IJ SOLN
INTRAMUSCULAR | Status: DC | PRN
  Administered 2018-05-11: 25 ug via INTRAVENOUS
  Administered 2018-05-11: 50 ug via INTRAVENOUS
  Administered 2018-05-11: 25 ug via INTRAVENOUS

## 2018-05-11 MED ORDER — URIBEL 118 MG PO CAPS: 1.0000 | ORAL_CAPSULE | Freq: Four times a day (QID) | ORAL | 3 refills | Status: DC | PRN

## 2018-05-11 MED ORDER — ONDANSETRON HCL 4 MG/2ML IJ SOLN
INTRAMUSCULAR | Status: DC | PRN
  Administered 2018-05-11: 4 mg via INTRAVENOUS

## 2018-05-11 MED ORDER — ONDANSETRON HCL 4 MG/2ML IJ SOLN
INTRAMUSCULAR | Status: AC
  Filled 2018-05-11: qty 2

## 2018-05-11 MED ORDER — CIPROFLOXACIN HCL 500 MG PO TABS: 500.0000 mg | ORAL_TABLET | Freq: Two times a day (BID) | ORAL | 0 refills | Status: DC

## 2018-05-11 MED ORDER — FENTANYL CITRATE (PF) 100 MCG/2ML IJ SOLN
INTRAMUSCULAR | Status: AC
  Filled 2018-05-11: qty 2

## 2018-05-11 MED ORDER — PROPOFOL 10 MG/ML IV BOLUS
INTRAVENOUS | Status: DC | PRN
  Administered 2018-05-11: 50 mg via INTRAVENOUS
  Administered 2018-05-11: 150 mg via INTRAVENOUS

## 2018-05-11 MED ORDER — CEFAZOLIN SODIUM-DEXTROSE 1-4 GM/50ML-% IV SOLN
1.0000 g | Freq: Once | INTRAVENOUS | Status: AC
  Administered 2018-05-11: 1 g via INTRAVENOUS

## 2018-05-11 MED ORDER — EPHEDRINE SULFATE 50 MG/ML IJ SOLN
INTRAMUSCULAR | Status: DC | PRN
  Administered 2018-05-11: 10 mg via INTRAVENOUS
  Administered 2018-05-11: 15 mg via INTRAVENOUS

## 2018-05-11 MED ORDER — HYOSCYAMINE SULFATE SL 0.125 MG SL SUBL: 0.1250 mg | SUBLINGUAL_TABLET | SUBLINGUAL | 3 refills | Status: DC | PRN

## 2018-05-11 MED ORDER — LIDOCAINE HCL URETHRAL/MUCOSAL 2 % EX GEL
CUTANEOUS | Status: DC | PRN
  Administered 2018-05-11: 1 via URETHRAL

## 2018-05-11 MED ORDER — LIDOCAINE HCL URETHRAL/MUCOSAL 2 % EX GEL
CUTANEOUS | Status: AC
  Filled 2018-05-11: qty 10

## 2018-05-11 MED ORDER — FENTANYL CITRATE (PF) 100 MCG/2ML IJ SOLN: 25.0000 ug | INTRAMUSCULAR | Status: DC | PRN

## 2018-05-11 MED ORDER — ONDANSETRON HCL 4 MG/2ML IJ SOLN: 4.0000 mg | Freq: Once | INTRAMUSCULAR | Status: DC | PRN

## 2018-05-11 MED ORDER — LACTATED RINGERS IV SOLN
INTRAVENOUS | Status: DC
  Administered 2018-05-11: 13:00:00 via INTRAVENOUS

## 2018-05-11 SURGICAL SUPPLY — 13 items
BAG DRAIN CYSTO-URO LG1000N (MISCELLANEOUS) ×2 IMPLANT
GLOVE BIO SURGEON STRL SZ7.5 (GLOVE) ×2 IMPLANT
GOWN STRL REUS W/ TWL LRG LVL3 (GOWN DISPOSABLE) ×1 IMPLANT
GOWN STRL REUS W/ TWL XL LVL3 (GOWN DISPOSABLE) ×1 IMPLANT
GOWN STRL REUS W/TWL LRG LVL3 (GOWN DISPOSABLE) ×1
GOWN STRL REUS W/TWL XL LVL3 (GOWN DISPOSABLE) ×1
KIT TURNOVER CYSTO (KITS) ×2 IMPLANT
PACK CYSTO AR (MISCELLANEOUS) ×2 IMPLANT
SET CYSTO W/LG BORE CLAMP LF (SET/KITS/TRAYS/PACK) ×2 IMPLANT
SURGILUBE 2OZ TUBE FLIPTOP (MISCELLANEOUS) ×2 IMPLANT
SYSTEM UROLIFT (Male Continence) ×6 IMPLANT
WATER STERILE IRR 1000ML POUR (IV SOLUTION) ×2 IMPLANT
WATER STERILE IRR 3000ML UROMA (IV SOLUTION) ×2 IMPLANT

## 2018-05-11 NOTE — H&P (Signed)
Date of Initial H&P: 05/06/18  History reviewed, patient examined, no change in status, stable for surgery. 

## 2018-05-11 NOTE — Discharge Instructions (Signed)
Benign Prostatic Hyperplasia Benign prostatic hyperplasia (BPH) is an enlarged prostate gland that is caused by the normal aging process and not by cancer. The prostate is a walnut-sized gland that is involved in the production of semen. It is located in front of the rectum and below the bladder. The bladder stores urine and the urethra is the tube that carries the urine out of the body. The prostate may get bigger as a man gets older. An enlarged prostate can press on the urethra. This can make it harder to pass urine. The build-up of urine in the bladder can cause infection. Back pressure and infection may progress to bladder damage and kidney (renal) failure. What are the causes? This condition is part of a normal aging process. However, not all men develop problems from this condition. If the prostate enlarges away from the urethra, urine flow will not be blocked. If it enlarges toward the urethra and compresses it, there will be problems passing urine. What increases the risk? This condition is more likely to develop in men over the age of 39 years. What are the signs or symptoms? Symptoms of this condition include:  Getting up often during the night to urinate.  Needing to urinate frequently during the day.  Difficulty starting urine flow.  Decrease in size and strength of your urine stream.  Leaking (dribbling) after urinating.  Inability to pass urine. This needs immediate treatment.  Inability to completely empty your bladder.  Pain when you pass urine. This is more common if there is also an infection.  Urinary tract infection (UTI).  How is this diagnosed? This condition is diagnosed based on your medical history, a physical exam, and your symptoms. Tests will also be done, such as:  A post-void bladder scan. This measures any amount of urine that may remain in your bladder after you finish urinating.  A digital rectal exam. In a rectal exam, your health care provider  checks your prostate by putting a lubricated, gloved finger into your rectum to feel the back of your prostate gland. This exam detects the size of your gland and any abnormal lumps or growths.  An exam of your urine (urinalysis).  A prostate specific antigen (PSA) screening. This is a blood test used to screen for prostate cancer.  An ultrasound. This test uses sound waves to electronically produce a picture of your prostate gland.  Your health care provider may refer you to a specialist in kidney and prostate diseases (urologist). How is this treated? Once symptoms begin, your health care provider will monitor your condition (active surveillance or watchful waiting). Treatment for this condition will depend on the severity of your condition. Treatment may include:  Observation and yearly exams. This may be the only treatment needed if your condition and symptoms are mild.  Medicines to relieve your symptoms, including: ? Medicines to shrink the prostate. ? Medicines to relax the muscle of the prostate.  Surgery in severe cases. Surgery may include: ? Prostatectomy. In this procedure, the prostate tissue is removed completely through an open incision or with a laparascope or robotics. ? Transurethral resection of the prostate (TURP). In this procedure, a tool is inserted through the opening at the tip of the penis (urethra). It is used to cut away tissue of the inner core of the prostate. The pieces are removed through the same opening of the penis. This removes the blockage. ? Transurethral incision (TUIP). In this procedure, small cuts are made in the prostate. This  lessens the prostate's pressure on the urethra. ? Transurethral microwave thermotherapy (TUMT). This procedure uses microwaves to create heat. The heat destroys and removes a small amount of prostate tissue. ? Transurethral needle ablation (TUNA). This procedure uses radio frequencies to destroy and remove a small amount of  prostate tissue. ? Interstitial laser coagulation (ILC). This procedure uses a laser to destroy and remove a small amount of prostate tissue. ? Transurethral electrovaporization (TUVP). This procedure uses electrodes to destroy and remove a small amount of prostate tissue. ? Prostatic urethral lift. This procedure inserts an implant to push the lobes of the prostate away from the urethra.  Follow these instructions at home:  Take over-the-counter and prescription medicines only as told by your health care provider.  Monitor your symptoms for any changes. Contact your health care provider with any changes.  Avoid drinking large amounts of liquid before going to bed or out in public.  Avoid or reduce how much caffeine or alcohol you drink.  Give yourself time when you urinate.  Keep all follow-up visits as told by your health care provider. This is important. Contact a health care provider if:  You have unexplained back pain.  Your symptoms do not get better with treatment.  You develop side effects from the medicine you are taking.  Your urine becomes very dark or has a bad smell.  Your lower abdomen becomes distended and you have trouble passing your urine. Get help right away if:  You have a fever or chills.  You suddenly cannot urinate.  You feel lightheaded, or very dizzy, or you faint.  There are large amounts of blood or clots in the urine.  Your urinary problems become hard to manage.  You develop moderate to severe low back or flank pain. The flank is the side of your body between the ribs and the hip. These symptoms may represent a serious problem that is an emergency. Do not wait to see if the symptoms will go away. Get medical help right away. Call your local emergency services (911 in the [place] Do not drive yourself to the hospital. Summary  Benign prostatic hyperplasia (BPH) is an enlarged prostate that is caused by the normal aging process and not by  cancer.  An enlarged prostate can press on the urethra. This can make it hard to pass urine.  This condition is part of a normal aging process and is more likely to develop in men over the age of 50 years.  Get help right away if you suddenly cannot urinate. This information is not intended to replace advice given to you by your health care provider. Make sure you discuss any questions you have with your health care provider. Document Released: 10/06/2005 Document Revised: 11/10/2016 Document Reviewed: 11/10/2016 Elsevier Interactive Patient Education  2018 Elsevier Inc.     AMBULATORY SURGERY  DISCHARGE INSTRUCTIONS   1) The drugs that you were given will stay in your system until tomorrow so for the next 24 hours you should not:  A) Drive an automobile B) Make any legal decisions C) Drink any alcoholic beverage   2) You may resume regular meals tomorrow.  Today it is better to start with liquids and gradually work up to solid foods.  You may eat anything you prefer, but it is better to start with liquids, then soup and crackers, and gradually work up to solid foods.   3) Please notify your doctor immediately if you have any unusual bleeding, trouble breathing, redness  and pain at the surgery site, drainage, fever, or pain not relieved by medication.    4) Additional Instructions:        Please contact your physician with any problems or Same Day Surgery at [phone], Monday through Friday 6 am to 4 pm, or [place] at [facility] Main number at [phone].

## 2018-05-11 NOTE — Transfer of Care (Signed)
Immediate Anesthesia Transfer of Care Note  Patient: Nicholas JarvisSherman C Charles  Procedure(s) Performed: CYSTOSCOPY WITH INSERTION OF UROLIFT (N/A Prostate)  Patient Location: PACU  Anesthesia Type:General  Level of Consciousness: drowsy and patient cooperative  Airway & Oxygen Therapy: Patient Spontanous Breathing and Patient connected to face mask oxygen  Post-op Assessment: Report given to RN and Post -op Vital signs reviewed and stable  Post vital signs: Reviewed and stable  Last Vitals:  Vitals Value Taken Time  BP 124/85 05/11/2018  2:49 PM  Temp 36.1 C 05/11/2018  2:49 PM  Pulse 100 05/11/2018  2:51 PM  Resp 15 05/11/2018  2:51 PM  SpO2 98 % 05/11/2018  2:51 PM  Vitals shown include unvalidated device data.  Last Pain:  Vitals:   05/11/18 1238  TempSrc: Oral  PainSc: 0-No pain         Complications: No apparent anesthesia complications

## 2018-05-11 NOTE — Op Note (Signed)
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 Evelene CroonWolff MD  Anesthesia: General  Indications:See the history and physical. After informed consent the above procedure(s) were requested     Technique and findings: After adequate general anesthesia had been obtained patient was placed into dorsal lithotomy position and the perineum was prepped and draped in the usual fashion.  21 French cystoscope was coupled with a camera and visually advanced into the bladder.  The bladder was thoroughly inspected.  Bladder was moderately trabeculated.  No bladder mucosal tumors were identified.  Both ureteral orifices were identified and had clear efflux.  The patient had lateral lobe prostatic hypertrophy with a 4 cm prostatic urethral length.  The initial UroLift implant was placed at 3 o'clock position millimeters distal to the bladder neck on the left  side.  The next implant was placed at the 9 o'clock position the right side at the same level as the initial implant.  Next implant was placed at the 3 o'clock position just proximal to the verumontanum.  Next implant was placed 9 o'clock position just proximal to the verumontanum.  Next implant was placed at the 4 o'clock position between the initial 2 implants on the left side.  The final implant was placed at the 4 o'clock position between the initial 2 implants on the right side.  At this point inspection of the prostatic urethra revealed wide patency and no significant bleeding.  The cystoscope was removed and 10 cc of Viscous Xylocaine instilled within the urethra.  The procedure was then terminated and patient transferred to the recovery room in stable condition.

## 2018-05-11 NOTE — Anesthesia Preprocedure Evaluation (Signed)
Anesthesia Evaluation  Patient identified by MRN, date of birth, ID band Patient awake    Reviewed: Allergy & Precautions, NPO status , Patient's Chart, lab work & pertinent test results  Airway Mallampati: II  TM Distance: >3 FB     Dental  (+) Teeth Intact   Pulmonary neg pulmonary ROS,    Pulmonary exam normal        Cardiovascular negative cardio ROS Normal cardiovascular exam     Neuro/Psych negative neurological ROS  negative psych ROS   GI/Hepatic negative GI ROS, Neg liver ROS,   Endo/Other  negative endocrine ROS  Renal/GU negative Renal ROS  negative genitourinary   Musculoskeletal  (+) Arthritis , Osteoarthritis,    Abdominal Normal abdominal exam  (+)   Peds negative pediatric ROS (+)  Hematology negative hematology ROS (+)   Anesthesia Other Findings   Reproductive/Obstetrics                             Anesthesia Physical Anesthesia Plan  ASA: II  Anesthesia Plan: General   Post-op Pain Management:    Induction: Intravenous  PONV Risk Score and Plan:   Airway Management Planned: LMA and Oral ETT  Additional Equipment:   Intra-op Plan:   Post-operative Plan: Extubation in OR  Informed Consent: I have reviewed the patients History and Physical, chart, labs and discussed the procedure including the risks, benefits and alternatives for the proposed anesthesia with the patient or authorized representative who has indicated his/her understanding and acceptance.   Dental advisory given  Plan Discussed with: Surgeon and CRNA  Anesthesia Plan Comments:         Anesthesia Quick Evaluation

## 2018-05-11 NOTE — Anesthesia Postprocedure Evaluation (Signed)
Anesthesia Post Note  Patient: Nicholas Shah  Procedure(s) Performed: CYSTOSCOPY WITH INSERTION OF UROLIFT (N/A Prostate)  Patient location during evaluation: PACU Anesthesia Type: General Level of consciousness: awake and alert and oriented Pain management: pain level controlled Vital Signs Assessment: post-procedure vital signs reviewed and stable Respiratory status: spontaneous breathing Cardiovascular status: blood pressure returned to baseline Anesthetic complications: no     Last Vitals:  Vitals:   05/11/18 1238 05/11/18 1449  BP: (!) 152/94 124/85  Pulse: 79 (!) 112  Resp: 18 (!) 9  Temp: (!) 36.1 C (!) 36.1 C  SpO2: 97% 97%    Last Pain:  Vitals:   05/11/18 1449  TempSrc:   PainSc: 0-No pain                 [name]

## 2018-05-11 NOTE — Anesthesia Procedure Notes (Signed)
Procedure Name: LMA Insertion Date/Time: 05/11/2018 2:11 PM Performed by: Omer JackWeatherly, Avalynne Diver, CRNA Pre-anesthesia Checklist: Patient identified, Patient being monitored, Timeout performed, Emergency Drugs available and Suction available Patient Re-evaluated:Patient Re-evaluated prior to induction Oxygen Delivery Method: Circle system utilized Preoxygenation: Pre-oxygenation with 100% oxygen Induction Type: IV induction Ventilation: Mask ventilation without difficulty LMA: LMA inserted LMA Size: 4.0 Tube type: Oral Number of attempts: 1 Placement Confirmation: positive ETCO2 and breath sounds checked- equal and bilateral Tube secured with: Tape Dental Injury: Teeth and Oropharynx as per pre-operative assessment

## 2018-05-11 NOTE — Anesthesia Post-op Follow-up Note (Signed)
Anesthesia QCDR form completed.        

## 2018-05-11 NOTE — OR Nursing (Signed)
Discharge instructions given to pt and wife. Both voice understanding.

## 2018-05-13 ENCOUNTER — Encounter: Payer: Self-pay | Admitting: Urology

## 2019-03-08 ENCOUNTER — Other Ambulatory Visit: Payer: Self-pay

## 2019-03-08 ENCOUNTER — Encounter: Payer: Self-pay | Admitting: Adult Health

## 2019-03-08 ENCOUNTER — Ambulatory Visit: Payer: Managed Care, Other (non HMO) | Admitting: Adult Health

## 2019-03-08 VITALS — BP 130/88 | HR 68 | Temp 98.3°F | Resp 16 | Ht 68.0 in | Wt 167.0 lb

## 2019-03-08 DIAGNOSIS — Z125 Encounter for screening for malignant neoplasm of prostate: Secondary | ICD-10-CM | POA: Diagnosis not present

## 2019-03-08 DIAGNOSIS — M719 Bursopathy, unspecified: Secondary | ICD-10-CM | POA: Insufficient documentation

## 2019-03-08 DIAGNOSIS — R972 Elevated prostate specific antigen [PSA]: Secondary | ICD-10-CM | POA: Diagnosis not present

## 2019-03-08 DIAGNOSIS — M6530 Trigger finger, unspecified finger: Secondary | ICD-10-CM | POA: Insufficient documentation

## 2019-03-08 DIAGNOSIS — Z008 Encounter for other general examination: Secondary | ICD-10-CM

## 2019-03-08 DIAGNOSIS — M65332 Trigger finger, left middle finger: Secondary | ICD-10-CM | POA: Insufficient documentation

## 2019-03-08 DIAGNOSIS — Z0189 Encounter for other specified special examinations: Secondary | ICD-10-CM | POA: Diagnosis not present

## 2019-03-08 NOTE — Progress Notes (Signed)
[facility]  Nicholas Shah DOB: 68 y.o. MRN: [id number]  Subjective:  Here for Biometric Screen/brief exam Patient is a 68 year old male in no acute distress who comes to the clinic for his biometric exam.  He works in the [employer].  He follows with Dr. Evelene Croon for his prostate - urology.  He does not have a primary care provider. Patient  denies any fever, body aches,chills, rash, chest pain, shortness of breath, nausea, vomiting, or diarrhea.   Objective: Blood pressure 130/88, pulse 68, temperature 98.3 F (36.8 C), temperature source Oral, resp. rate 16, height 5\' 8"  (1.727 m), weight 167 lb (75.8 kg), SpO2 98 %. NAD HEENT: Within normal limits Neck: Normal: neck supple  Heart: Regular rate and rhythm Lungs: Clear  Assessment: Biometric screen   Plan:  Fasting glucose and lipids. Discussed with patient that today's visit here is a limited biometric screening visit (not a comprehensive exam or management of any chronic problems) Discussed some health issues, including healthy eating habits and exercise. Encouraged to find a PCP for annual comprehensive preventive and wellness care (and if applicable, any chronic issues). Questions invited and answered.  Provider also recommends patient see primary care physician for a routine physical and to establish primary care. Patient may chose provider of choice. Also gave the [place]  PHYSICIAN REFERRAL LINE at [phone] or web site at [email or web address] to help assist with finding a primary care doctor. Patient understands this office is acute care office only and no primary care is done at this office.   He has orders for PSA lab from Dr. Artis Flock and will follow up with him as directed.    I will have the office call you on your glucose and cholesterol results when they return if you have not heard within 1 week please call the office.  This biometric physical is a brief  physical and the only labs done are glucose and your lipid panel(cholesterol) and is  not a substitute for seeing a primary care provider for a complete annual physical. Please see a primary care physician for routine health maintenance, labs and full physical at least yearly and follow up as recommended by your provider. Provider also recommends if you do not have a primary care provider for patient to establish care as soon as possible .Patient may chose provider of choice. Also gave the [place]  PHYSICIAN/PROVIDER  REFERRAL LINE at [phone] or web site at [email or web address] to help assist with finding a primary care doctor.  Patient verbalizes understanding that his office is acute care only and not a substitute for a primary care or for the management of chronic conditions.     Follow up with primary care as needed for chronic and maintenance health care- can be seen in this employee clinic for acute care.

## 2019-03-08 NOTE — Patient Instructions (Signed)
Health Maintenance, Male A healthy lifestyle and preventive care is important for your health and wellness. Ask your health care provider about what schedule of regular examinations is right for you. What should I know about weight and diet? Eat a Healthy Diet  Eat plenty of vegetables, fruits, whole grains, low-fat dairy products, and lean protein.  Do not eat a lot of foods high in solid fats, added sugars, or salt.  Maintain a Healthy Weight Regular exercise can help you achieve or maintain a healthy weight. You should:  Do at least 150 minutes of exercise each week. The exercise should increase your heart rate and make you sweat (moderate-intensity exercise).  Do strength-training exercises at least twice a week. Watch Your Levels of Cholesterol and Blood Lipids  Have your blood tested for lipids and cholesterol every 5 years starting at 67 years of age. If you are at high risk for heart disease, you should start having your blood tested when you are 67 years old. You may need to have your cholesterol levels checked more often if: ? Your lipid or cholesterol levels are high. ? You are older than 67 years of age. ? You are at high risk for heart disease. What should I know about cancer screening? Many types of cancers can be detected early and may often be prevented. Lung Cancer  You should be screened every year for lung cancer if: ? You are a current smoker who has smoked for at least 30 years. ? You are a former smoker who has quit within the past 15 years.  Talk to your health care provider about your screening options, when you should start screening, and how often you should be screened. Colorectal Cancer  Routine colorectal cancer screening usually begins at 67 years of age and should be repeated every 5-10 years until you are 67 years old. You may need to be screened more often if early forms of precancerous polyps or small growths are found. Your health care provider may  recommend screening at an earlier age if you have risk factors for colon cancer.  Your health care provider may recommend using home test kits to check for hidden blood in the stool.  A small camera at the end of a tube can be used to examine your colon (sigmoidoscopy or colonoscopy). This checks for the earliest forms of colorectal cancer. Prostate and Testicular Cancer  Depending on your age and overall health, your health care provider may do certain tests to screen for prostate and testicular cancer.  Talk to your health care provider about any symptoms or concerns you have about testicular or prostate cancer. Skin Cancer  Check your skin from head to toe regularly.  Tell your health care provider about any new moles or changes in moles, especially if: ? There is a change in a moles size, shape, or color. ? You have a mole that is larger than a pencil eraser.  Always use sunscreen. Apply sunscreen liberally and repeat throughout the day.  Protect yourself by wearing long sleeves, pants, a wide-brimmed hat, and sunglasses when outside. What should I know about heart disease, diabetes, and high blood pressure?  If you are 83-64 years of age, have your blood pressure checked every 3-5 years. If you are 77 years of age or older, have your blood pressure checked every year. You should have your blood pressure measured twice--once when you are at a hospital or clinic, and once when you are not at a hospital  or clinic. Record the average of the two measurements. To check your blood pressure when you are not at a hospital or clinic, you can use: °? An automated blood pressure machine at a pharmacy. °? A home blood pressure monitor. °· Talk to your health care provider about your target blood pressure. °· If you are between 45-79 years old, ask your health care provider if you should take aspirin to prevent heart disease. °· Have regular diabetes screenings by checking your fasting blood sugar  level. °? If you are at a normal weight and have a low risk for diabetes, have this test once every three years after the age of 45. °? If you are overweight and have a high risk for diabetes, consider being tested at a younger age or more often. °· A one-time screening for abdominal aortic aneurysm (AAA) by ultrasound is recommended for men aged 65-75 years who are current or former smokers. °What should I know about preventing infection? °Hepatitis B °If you have a higher risk for hepatitis B, you should be screened for this virus. Talk with your health care provider to find out if you are at risk for hepatitis B infection. °Hepatitis C °Blood testing is recommended for: °· Everyone born from 1945 through 1965. °· Anyone with known risk factors for hepatitis C. °Sexually Transmitted Diseases (STDs) °· You should be screened each year for STDs including gonorrhea and chlamydia if: °? You are sexually active and are younger than 67 years of age. °? You are older than 67 years of age and your health care provider tells you that you are at risk for this type of infection. °? Your sexual activity has changed since you were last screened and you are at an increased risk for chlamydia or gonorrhea. Ask your health care provider if you are at risk. °· Talk with your health care provider about whether you are at high risk of being infected with HIV. Your health care provider may recommend a prescription medicine to help prevent HIV infection. °What else can I do? °· Schedule regular health, dental, and eye exams. °· Stay current with your vaccines (immunizations). °· Do not use any tobacco products, such as cigarettes, chewing tobacco, and e-cigarettes. If you need help quitting, ask your health care provider. °· Limit alcohol intake to no more than 2 drinks per day. One drink equals 12 ounces of beer, 5 ounces of wine, or 1½ ounces of hard liquor. °· Do not use street drugs. °· Do not share needles. °· Ask your health  care provider for help if you need support or information about quitting drugs. °· Tell your health care provider if you often feel depressed. °· Tell your health care provider if you have ever been abused or do not feel safe at home. °This information is not intended to replace advice given to you by your health care provider. Make sure you discuss any questions you have with your health care provider. °Document Released: 04/03/2008 Document Revised: 06/04/2016 Document Reviewed: 07/10/2015 °Elsevier Interactive Patient Education © 2019 Elsevier Inc. ° ° °I will have the office call you on your glucose and cholesterol results when they return if you have not heard within 1 week please call the office.  °This biometric physical is a brief physical and the only labs done are glucose and your lipid panel(cholesterol) and is  not a substitute for seeing a primary care provider for a complete annual physical. Please see a primary care physician for   routine health maintenance, labs and full physical at least yearly and follow up as recommended by your provider. Provider also recommends if you do not have a primary care provider for patient to establish care as soon as possible .Patient may chose provider of choice. Also gave the [place]  PHYSICIAN/PROVIDER  REFERRAL LINE at [phone] or web site at [email or web address] to help assist with finding a primary care doctor.  Patient verbalizes understanding that his office is acute care only and not a substitute for a primary care or for the management of chronic conditions.    Follow up with primary care as needed for chronic and maintenance health care- can be seen in this employee clinic for acute care.

## 2019-03-11 LAB — GLUCOSE, RANDOM: Glucose: 98 mg/dL (ref 65–99)

## 2019-03-11 LAB — LIPID PANEL WITH LDL/HDL RATIO
Cholesterol, Total: 254 mg/dL — ABNORMAL HIGH (ref 100–199)
HDL: 64 mg/dL (ref >39)
LDL Calculated: 167 mg/dL — ABNORMAL HIGH (ref 0–99)
LDl/HDL Ratio: 2.6 ratio (ref 0.0–3.6)
Triglycerides: 115 mg/dL (ref 0–149)
VLDL Cholesterol Cal: 23 mg/dL (ref 5–40)

## 2019-03-11 LAB — PSA: Prostate Specific Ag, Serum: 7.4 ng/mL — ABNORMAL HIGH (ref 0.0–4.0)

## 2019-03-11 NOTE — Progress Notes (Signed)
03/11/19 Patient was called by provider with results as sent to [facility] in below note, he also asked his PSA level- which was ordered by his urologist- he was advised of number and that this was elevated and he should reach out to his urologist for further follow up within 1 week.

## 2019-03-11 NOTE — Progress Notes (Signed)
Bretlyn, will you let patient know the following please.  Patient's fasting glucose is normal. Total cholesterol is 254, this is up from last year as it was 226, normal ranges 100-199.  Patient's LDL bad cholesterol is 167, normal ranges 0-99.  I recommend a heart healthy diet, decrease refined sugar and low-cholesterol and decrease carbohydrates to prevent heart disease in the future.  I also recommend that patient find a primary care provider discussed at visit follow-up regarding cholesterol with a primary care provider in the near future.

## 2019-03-15 ENCOUNTER — Other Ambulatory Visit: Payer: Self-pay | Admitting: Urology

## 2019-03-15 ENCOUNTER — Other Ambulatory Visit (HOSPITAL_COMMUNITY): Payer: Self-pay | Admitting: Urology

## 2019-03-15 DIAGNOSIS — R972 Elevated prostate specific antigen [PSA]: Secondary | ICD-10-CM

## 2019-03-21 ENCOUNTER — Ambulatory Visit: Payer: Managed Care, Other (non HMO)

## 2019-04-01 ENCOUNTER — Ambulatory Visit: Payer: Managed Care, Other (non HMO)

## 2020-03-15 ENCOUNTER — Ambulatory Visit
Admission: RE | Admit: 2020-03-15 | Discharge: 2020-03-15 | Disposition: A | Discharge status: Home / Self Care | Payer: Managed Care, Other (non HMO) | Attending: Nurse Practitioner | Admitting: Nurse Practitioner

## 2020-03-15 ENCOUNTER — Ambulatory Visit
Admission: RE | Admit: 2020-03-15 | Discharge: 2020-03-15 | Disposition: A | Discharge status: Home / Self Care | Payer: Managed Care, Other (non HMO) | Source: Ambulatory Visit | Attending: Nurse Practitioner | Admitting: Nurse Practitioner

## 2020-03-15 ENCOUNTER — Encounter: Payer: Self-pay | Admitting: Nurse Practitioner

## 2020-03-15 ENCOUNTER — Ambulatory Visit: Payer: Managed Care, Other (non HMO) | Admitting: Nurse Practitioner

## 2020-03-15 ENCOUNTER — Other Ambulatory Visit: Payer: Self-pay

## 2020-03-15 VITALS — BP 132/84 | HR 72 | Temp 98.0°F | Resp 16 | Ht 67.0 in | Wt 162.0 lb

## 2020-03-15 DIAGNOSIS — R05 Cough: Secondary | ICD-10-CM

## 2020-03-15 DIAGNOSIS — R059 Cough, unspecified: Secondary | ICD-10-CM

## 2020-03-15 DIAGNOSIS — Z008 Encounter for other general examination: Secondary | ICD-10-CM

## 2020-03-15 NOTE — Progress Notes (Signed)
Subjective:     Patient ID: Nicholas Shah, male   DOB: [birth year], 69 y.o.   MRN: [id number]  HPI  Nicholas Shah is a 69 y.o. male who has worked in [employer] for [employer] past 12 years. He presents to [facility] today for his yearly biomedical screening exam. Nicholas Shah reports a cough that has been ongoing for Nicholas past 5 months. He describes Nicholas cough as dry, nagging. He reports having both doses of Nicholas Covid vaccine and has had Nicholas Flu vaccine. He has taken nothing for his symptoms. Due to exposures at work he has been tested several times for Covid, Nicholas last was 3 weeks ago and all tests have been negative. Employee has not had fever, chills or other symptoms. He denies any unexpected weight loss.   PCP: None. Employee reports he has not been evaluated by a PCP in Nicholas past 12 years while working for [employer].   PMH: Arthritis, R lateral meniscus tear  Surgery: Cystoscopy, right knee arthroscopy, facial reconstruction, appendectomy, carpal tunnel, trigger finger  SH: Denies use of tobacco or drugs or ETOH  Meds: Prostate 2.4 (OTC) Allergies: NKDA Immunizations: UTD, has had both doses of Covid vaccine and influenza vaccine  Diet/Exercise: Eats a lot of fast food and does not have a regular exercise routine but does a lot of waking on Nicholas job.    Review of Systems  Constitutional: Negative for activity change, appetite change, chills, fatigue, fever and unexpected weight change.  HENT: Negative for congestion, sore throat, trouble swallowing and voice change.   Eyes: Negative for redness.  Respiratory: Positive for cough.   Cardiovascular: Negative for chest pain.  Gastrointestinal: Negative for abdominal pain.  Musculoskeletal: Negative for back pain and neck pain.  Skin: Negative for rash.  Neurological: Negative for speech difficulty.       Objective:   Physical Exam Vitals and nursing note reviewed.  Constitutional:      Appearance: He is well-developed.   HENT:     Head: Normocephalic and atraumatic.     Jaw: No trismus.     Right Ear: External ear normal.     Left Ear: External ear normal.     Nose: Nose normal.     Mouth/Throat:     Mouth: Mucous membranes are moist.     Pharynx: Oropharynx is clear.  Eyes:     General: Lids are normal.     Pupils: Pupils are equal, round, and reactive to light.  Neck:     Vascular: No carotid bruit.     Trachea: Trachea normal.  Cardiovascular:     Rate and Rhythm: Normal rate and regular rhythm.     Pulses: Normal pulses.     Heart sounds: No murmur.  Pulmonary:     Effort: No respiratory distress.     Breath sounds: No decreased air movement. No wheezing, rhonchi or rales.  Abdominal:     Palpations: Abdomen is soft.     Tenderness: There is no abdominal tenderness.  Musculoskeletal:        General: Normal range of motion.     Cervical back: Neck supple. No rigidity. No pain with movement. Normal range of motion.  Lymphadenopathy:     Cervical: No cervical adenopathy.  Skin:    General: Skin is warm and dry.  Neurological:     General: No focal deficit present.     Mental Status: He is alert and oriented to person, place, and  time.     Cranial Nerves: Cranial nerves are intact.     Motor: No weakness or pronator drift.     Coordination: Romberg sign negative.     Gait: Gait is intact.     Deep Tendon Reflexes:     Reflex Scores:      Bicep reflexes are 2+ on Nicholas right side and 2+ on Nicholas left side.      Brachioradialis reflexes are 2+ on Nicholas right side and 2+ on Nicholas left side.      Patellar reflexes are 2+ on Nicholas right side and 2+ on Nicholas left side.    Comments: Ambulatory with steady gait, stands on one foot without difficulty. Full ROM of back and extremities. Grips are equal.   Psychiatric:        Mood and Affect: Mood normal.        Behavior: Behavior normal.   BP 132/84 (BP Location: Right Arm, Patient Position: Sitting, Cuff Size: Normal)   Pulse 72   Temp 98 F (36.7 C)  (Temporal)   Resp 16   Ht 5\' 7"  (1.702 m)   Wt 162 lb (73.5 kg)   SpO2 97%   BMI 25.37 kg/m       Assessment:    69 y.o. male here today for biometric physical exam and c/o cough x 5 months. Limited physical exam normal.     Plan:    CXR   DG Chest 2 View  Result Date: 03/15/2020 CLINICAL DATA:  Chronic cough EXAM: CHEST - 2 VIEW COMPARISON:  April 06 2014. FINDINGS: Lungs are clear. Heart size and pulmonary vascularity are normal. No adenopathy. There is degenerative change in Nicholas thoracic spine. IMPRESSION: Lungs clear.  Cardiac silhouette within normal limits. Electronically Signed   By: 07-07-1974 III M.D.   On: 03/15/2020 10:46   Discussed with [employer] employee results of CXR and possible seasonal allergies. OTC medication as needed for cough. Discussed healthy diet and exercise and need for PCP. Employee given Nicholas number to call to try and establish care. Employee given opportunity to ask questions and all questions answered. Instructions given verbally and in writing.

## 2020-03-15 NOTE — Patient Instructions (Signed)
Cough, Adult A cough helps to clear your throat and lungs. A cough may be a sign of an illness or another medical condition. An acute cough may only last 2-3 weeks, while a chronic cough may last 8 or more weeks. Many things can cause a cough. They include:  Germs (viruses or bacteria) that attack the airway.  Breathing in things that bother (irritate) your lungs.  Allergies.  Asthma.  Mucus that runs down the back of your throat (postnasal drip).  Smoking.  Acid backing up from the stomach into the tube that moves food from the mouth to the stomach (gastroesophageal reflux).  Some medicines.  Lung problems.  Other medical conditions, such as heart failure or a blood clot in the lung (pulmonary embolism). Follow these instructions at home: Medicines  Take over-the-counter and prescription medicines only as told by your doctor.  Talk with your doctor before you take medicines that stop a cough (coughsuppressants). Lifestyle   Do not smoke, and try not to be around smoke. Do not use any products that contain nicotine or tobacco, such as cigarettes, e-cigarettes, and chewing tobacco. If you need help quitting, ask your doctor.  Drink enough fluid to keep your pee (urine) pale yellow.  Avoid caffeine.  Do not drink alcohol if your doctor tells you not to drink. General instructions   Watch for any changes in your cough. Tell your doctor about them.  Always cover your mouth when you cough.  Stay away from things that make you cough, such as perfume, candles, campfire smoke, or cleaning products.  If the air is dry, use a cool mist vaporizer or humidifier in your home.  If your cough is worse at night, try using extra pillows to raise your head up higher while you sleep.  Rest as needed.  Keep all follow-up visits as told by your doctor. This is important. Contact a doctor if:  You have new symptoms.  You cough up pus.  Your cough does not get better after 2-3  weeks, or your cough gets worse.  Cough medicine does not help your cough and you are not sleeping well.  You have pain that gets worse or pain that is not helped with medicine.  You have a fever.  You are losing weight and you do not know why.  You have night sweats. Get help right away if:  You cough up blood.  You have trouble breathing.  Your heartbeat is very fast. These symptoms may be an emergency. Do not wait to see if the symptoms will go away. Get medical help right away. Call your local emergency services (911 in the [place] Do not drive yourself to the hospital. Summary  A cough helps to clear your throat and lungs. Many things can cause a cough.  Take over-the-counter and prescription medicines only as told by your doctor.  Always cover your mouth when you cough.  Contact a doctor if you have new symptoms or you have a cough that does not get better or gets worse. This information is not intended to replace advice given to you by your health care provider. Make sure you discuss any questions you have with your health care provider. Document Revised: 10/25/2018 Document Reviewed: 10/25/2018 Elsevier Patient Education  2020 [employer].  Immunization Schedule, 65 Years and Older  Vaccines are usually given at various ages, according to a schedule. Your health care provider will recommend vaccines for you based on your age, medical history, and lifestyle or  other factors such as travel or where you work. You may receive vaccines as individual doses or as more than one vaccine together in one shot (combination vaccines). Talk with your health care provider about the risks and benefits of combination vaccines. Recommended immunizations for 65 and older Influenza vaccine  You should get a dose of the influenza vaccine every year. Tetanus, diphtheria, and pertussis vaccine A vaccine that protects against tetanus, diphtheria, and pertussis is known as the Tdap vaccine.  A vaccine that protects against tetanus and diphtheria is known as the Td vaccine.  You should only get the Td vaccine if you have had at least 1 dose of the Tdap vaccine.  You should get 1 dose of the Td or Tdap vaccine every 10 years, or you should get 1 dose of the Tdap vaccine if: ? You have not previously gotten a Tdap vaccine. ? You do not know if you have ever gotten a Tdap vaccine. Zoster vaccine This is also known as the RZV vaccine. You should get 2 doses of the RZV vaccine 2 to 6 months apart. It is important to get the RZV vaccine even if you:  Have had shingles.  Have received the ZVL vaccine, an older version of the RZV vaccine.  Are unsure if you have had chickenpox (varicella). Pneumococcal polysaccharide vaccine This is also known as the PPSV23 vaccine. You should get 1 dose of the PPSV23 vaccine. Pneumococcal conjugate vaccine This is also known as the PCV13 vaccine. Talk to your health care provider about whether it is appropriate for you to get the PCV13 vaccine. Hepatitis A vaccine This is also known as the HepA vaccine. If you did not get the HepA vaccine previously, you should get it if:  You are at risk for a hepatitis A infection. You may be at risk for infection if you: ? Have chronic liver disease. ? Have HIV or AIDS. ? Are a man who has sex with men. ? Use drugs. ? Are homeless. ? May be exposed to hepatitis A through work. ? Travel to countries where hepatitis A is common. ? Have or will have close contact with someone who was adopted from another country.  You are not at risk for infection but want protection from hepatitis A. Hepatitis B vaccine This is also known as the HepB vaccine. If you did not get the HepB vaccine previously, you should get it if:  You are at risk for hepatitis B infection. You are at risk if you: ? Have chronic liver disease. ? Have HIV or AIDS. ? Have sex with a partner who has hepatitis B, or:  You have multiple sex  partners.  You are a man who has sex with men. ? Use drugs. ? May be exposed to hepatitis B through work. ? Live with someone who has hepatitis B. ? Receive dialysis treatment. ? Have diabetes. ? Travel to countries where hepatitis B is common.  You are not at risk of infection but want protection from hepatitis B. Varicella vaccine This is also known as the VAR vaccine. You may need to get the VAR vaccine if you do not have evidence of immunity (by a blood test) and you may be exposed to varicella through work. This is especially important if you work in a health care setting. Meningococcal conjugate vaccine This is also known as the MenACWY vaccine. You may need to get the MenACWY vaccine if you:  Have not been given the vaccine before.  Need to catch up on doses you missed in the past. This vaccine is especially important if you:  Do not have a spleen.  Have sickle cell disease.  Have HIV.  Take medicines that suppress your body's disease-fighting system (immune system).  Travel to countries where meningococcal disease is common.  Are exposed to Neisseria meningitidis at work. Serogroup B meningococcal vaccine This is also known as the MenB vaccine. You may need to get the MenB vaccine if you:  Have not been given the vaccine before.  Need to catch up on doses you missed in the past. This vaccine is especially important if you:  Do not have a spleen.  Have sickle cell disease.  Take medicines that suppress your immune system.  Are exposed to Neisseria meningitidis at work. Haemophilus influenzae type b vaccine This is also known as the Hib vaccine. Anyone older than 68 years of age is usually not given the Hib vaccine. However, if you have certain high-risk conditions, you may need to get this vaccine. These conditions include:  Not having a spleen.  Having received a stem cell transplant. Before you get a vaccine: Talk with your health care provider about  which vaccines are right for you. This is especially important if:  You previously had a reaction after getting a vaccine.  You have a weakened immune system. You may have a weakened immune system if you: ? Are taking medicines that reduce (suppress) the activity of your immune system. ? Are taking medicines to treat cancer (chemotherapy). ? Have HIV or AIDS.  You work in an environment where you may be exposed to a disease.  You plan to travel outside of the country.  You have a chronic illness, such as heart disease, kidney disease, diabetes, or lung disease. Summary  Before you get a vaccine, tell your health care provider if you have reacted to vaccines in the past or have a condition that weakens your immune system.  You should get a dose of the influenza vaccine every year and a dose of the Td or Tdap vaccine every 10 years.  You should get 2 doses of the RZV vaccine 2 to 6 months apart.  Depending on your medical history and your risk factors, you may need other vaccines. Ask your health care provider whether you are up to date on all your vaccines. This information is not intended to replace advice given to you by your health care provider. Make sure you discuss any questions you have with your health care provider. Document Revised: 08/02/2019 Document Reviewed: 08/02/2019 Elsevier Patient Education  2020 [employer].  Health Maintenance After Age 65 After age 73, you are at a higher risk for certain long-term diseases and infections as well as injuries from falls. Falls are a major cause of broken bones and head injuries in people who are older than age 25. Getting regular preventive care can help to keep you healthy and well. Preventive care includes getting regular testing and making lifestyle changes as recommended by your health care provider. Talk with your health care provider about:  Which screenings and tests you should have. A screening is a test that checks for a  disease when you have no symptoms.  A diet and exercise plan that is right for you. What should I know about screenings and tests to prevent falls? Screening and testing are the best ways to find a health problem early. Early diagnosis and treatment give you the best chance of managing  medical conditions that are common after age 62. Certain conditions and lifestyle choices may make you more likely to have a fall. Your health care provider may recommend:  Regular vision checks. Poor vision and conditions such as cataracts can make you more likely to have a fall. If you wear glasses, make sure to get your prescription updated if your vision changes.  Medicine review. Work with your health care provider to regularly review all of the medicines you are taking, including over-the-counter medicines. Ask your health care provider about any side effects that may make you more likely to have a fall. Tell your health care provider if any medicines that you take make you feel dizzy or sleepy.  Osteoporosis screening. Osteoporosis is a condition that causes the bones to get weaker. This can make the bones weak and cause them to break more easily.  Blood pressure screening. Blood pressure changes and medicines to control blood pressure can make you feel dizzy.  Strength and balance checks. Your health care provider may recommend certain tests to check your strength and balance while standing, walking, or changing positions.  Foot health exam. Foot pain and numbness, as well as not wearing proper footwear, can make you more likely to have a fall.  Depression screening. You may be more likely to have a fall if you have a fear of falling, feel emotionally low, or feel unable to do activities that you used to do.  Alcohol use screening. Using too much alcohol can affect your balance and may make you more likely to have a fall. What actions can I take to lower my risk of falls? General instructions  Talk with  your health care provider about your risks for falling. Tell your health care provider if: ? You fall. Be sure to tell your health care provider about all falls, even ones that seem minor. ? You feel dizzy, sleepy, or off-balance.  Take over-the-counter and prescription medicines only as told by your health care provider. These include any supplements.  Eat a healthy diet and maintain a healthy weight. A healthy diet includes low-fat dairy products, low-fat (lean) meats, and fiber from whole grains, beans, and lots of fruits and vegetables. Home safety  Remove any tripping hazards, such as rugs, cords, and clutter.  Install safety equipment such as grab bars in bathrooms and safety rails on stairs.  Keep rooms and walkways well-lit. Activity   Follow a regular exercise program to stay fit. This will help you maintain your balance. Ask your health care provider what types of exercise are appropriate for you.  If you need a cane or walker, use it as recommended by your health care provider.  Wear supportive shoes that have nonskid soles. Lifestyle  Do not drink alcohol if your health care provider tells you not to drink.  If you drink alcohol, limit how much you have: ? 0-1 drink a day for women. ? 0-2 drinks a day for men.  Be aware of how much alcohol is in your drink. In the [place] one drink equals one typical bottle of beer (12 oz), one-half glass of wine (5 oz), or one shot of hard liquor (1 oz).  Do not use any products that contain nicotine or tobacco, such as cigarettes and e-cigarettes. If you need help quitting, ask your health care provider. Summary  Having a healthy lifestyle and getting preventive care can help to protect your health and wellness after age 66.  Screening and testing are the best  way to find a health problem early and help you avoid having a fall. Early diagnosis and treatment give you the best chance for managing medical conditions that are more common  for people who are older than age 68.  Falls are a major cause of broken bones and head injuries in people who are older than age 68. Take precautions to prevent a fall at home.  Work with your health care provider to learn what changes you can make to improve your health and wellness and to prevent falls. This information is not intended to replace advice given to you by your health care provider. Make sure you discuss any questions you have with your health care provider. Document Revised: 01/27/2019 Document Reviewed: 08/19/2017 Elsevier Patient Education  2020 [employer]

## 2020-03-16 LAB — LIPID PANEL
Chol/HDL Ratio: 3.5 ratio (ref 0.0–5.0)
Cholesterol, Total: 238 mg/dL — ABNORMAL HIGH (ref 100–199)
HDL: 68 mg/dL (ref >39)
LDL Chol Calc (NIH): 153 mg/dL — ABNORMAL HIGH (ref 0–99)
Triglycerides: 100 mg/dL (ref 0–149)
VLDL Cholesterol Cal: 17 mg/dL (ref 5–40)

## 2020-03-16 LAB — GLUCOSE, RANDOM: Glucose: 95 mg/dL (ref 65–99)

## 2020-03-29 ENCOUNTER — Other Ambulatory Visit: Payer: Self-pay

## 2020-03-29 ENCOUNTER — Encounter: Payer: Self-pay | Admitting: Nurse Practitioner

## 2020-03-29 ENCOUNTER — Ambulatory Visit (INDEPENDENT_AMBULATORY_CARE_PROVIDER_SITE_OTHER): Payer: Managed Care, Other (non HMO) | Admitting: Nurse Practitioner

## 2020-03-29 VITALS — BP 136/78 | HR 80 | Temp 98.2°F | Ht 67.7 in | Wt 161.4 lb

## 2020-03-29 DIAGNOSIS — R972 Elevated prostate specific antigen [PSA]: Secondary | ICD-10-CM

## 2020-03-29 DIAGNOSIS — E785 Hyperlipidemia, unspecified: Secondary | ICD-10-CM | POA: Insufficient documentation

## 2020-03-29 DIAGNOSIS — N1831 Chronic kidney disease, stage 3a: Secondary | ICD-10-CM | POA: Diagnosis not present

## 2020-03-29 DIAGNOSIS — E7800 Pure hypercholesterolemia, unspecified: Secondary | ICD-10-CM | POA: Diagnosis not present

## 2020-03-29 DIAGNOSIS — J301 Allergic rhinitis due to pollen: Secondary | ICD-10-CM

## 2020-03-29 DIAGNOSIS — Z0000 Encounter for general adult medical examination without abnormal findings: Secondary | ICD-10-CM | POA: Insufficient documentation

## 2020-03-29 DIAGNOSIS — Z7689 Persons encountering health services in other specified circumstances: Secondary | ICD-10-CM | POA: Diagnosis not present

## 2020-03-29 DIAGNOSIS — J309 Allergic rhinitis, unspecified: Secondary | ICD-10-CM | POA: Insufficient documentation

## 2020-03-29 DIAGNOSIS — R030 Elevated blood-pressure reading, without diagnosis of hypertension: Secondary | ICD-10-CM | POA: Insufficient documentation

## 2020-03-29 NOTE — Assessment & Plan Note (Signed)
Noted on recent labs in May 2021 with LDL 153, had at length discussion with patient on current ASCVD 11.7%.  He does not wish to start medication at this time, is aware of risks with elevation in LDL and family history.  Will focus on diet and exercise over next 6 months, endorses enjoying fried food, and will return at that time for fasting labs.  If ongoing elevations recommend starting statin and educated him on this today.

## 2020-03-29 NOTE — Assessment & Plan Note (Signed)
Noted in office today, but repeat close to goal and reports readings at [facility] office often 120/80 range, suspect elevation related to recent Claritin D use and recommend he stop this and switch to plain Claritin.  Recommend monitoring BP at home or work a few days a week and documenting + focus on DASH diet.

## 2020-03-29 NOTE — Assessment & Plan Note (Signed)
Continue collaboration with urology, Dr. Evelene Croon, recent notes reviewed.

## 2020-03-29 NOTE — Patient Instructions (Signed)
Preventing High Cholesterol Cholesterol is a white, waxy substance similar to fat that the human body needs to help build cells. The liver makes all the cholesterol that a person's body needs. Having high cholesterol (hypercholesterolemia) increases a person's risk for heart disease and stroke. Extra (excess) cholesterol comes from the food the person eats. High cholesterol can often be prevented with diet and lifestyle changes. If you already have high cholesterol, you can control it with diet and lifestyle changes and with medicine. How can high cholesterol affect me? If you have high cholesterol, deposits (plaques) may build up on the walls of your arteries. The arteries are the blood vessels that carry blood away from your heart. Plaques make the arteries narrower and stiffer. This can limit or block blood flow and cause blood clots to form. Blood clots:  Are tiny balls of cells that form in your blood.  Can move to the heart or brain, causing a heart attack or stroke. Plaques in arteries greatly increase your risk for heart attack and stroke.Making diet and lifestyle changes can reduce your risk for these conditions that may threaten your life. What can increase my risk? This condition is more likely to develop in people who:  Eat foods that are high in saturated fat or cholesterol. Saturated fat is mostly found in: ? Foods that contain animal fat, such as red meat and some dairy products. ? Certain fatty foods made from plants, such as tropical oils.  Are overweight.  Are not getting enough exercise.  Have a family history of high cholesterol. What actions can I take to prevent this? Nutrition   Eat less saturated fat.  Avoid trans fats (partially hydrogenated oils). These are often found in margarine and in some baked goods, fried foods, and snacks bought in packages.  Avoid precooked or cured meat, such as sausages or meat loaves.  Avoid foods and drinks that have added  sugars.  Eat more fruits, vegetables, and whole grains.  Choose healthy sources of protein, such as fish, poultry, lean cuts of red meat, beans, peas, lentils, and nuts.  Choose healthy sources of fat, such as: ? Nuts. ? Vegetable oils, especially olive oil. ? Fish that have healthy fats (omega-3 fatty acids), such as mackerel or salmon. The items listed above may not be a complete list of recommended foods and beverages. Contact a dietitian for more information. Lifestyle  Lose weight if you are overweight. Losing 5-10 lb (2.3-4.5 kg) can help prevent or control high cholesterol. It can also lower your risk for diabetes and high blood pressure. Ask your health care provider to help you with a diet and exercise plan to lose weight safely.  Do not use any products that contain nicotine or tobacco, such as cigarettes, e-cigarettes, and chewing tobacco. If you need help quitting, ask your health care provider.  Limit your alcohol intake. ? Do not drink alcohol if:  Your health care provider tells you not to drink.  You are pregnant, may be pregnant, or are planning to become pregnant. ? If you drink alcohol:  Limit how much you use to:  0-1 drink a day for women.  0-2 drinks a day for men.  Be aware of how much alcohol is in your drink. In the [place] one drink equals one 12 oz bottle of beer (355 mL), one 5 oz glass of wine (148 mL), or one 1 oz glass of hard liquor (44 mL). Activity   Get enough exercise. Each week, do at   least 150 minutes of exercise that takes a medium level of effort (moderate-intensity exercise). ? This is exercise that:  Makes your heart beat faster and makes you breathe harder than usual.  Allows you to still be able to talk. ? You could exercise in short sessions several times a day or longer sessions a few times a week. For example, on 5 days each week, you could walk fast or ride your bike 3 times a day for 10 minutes each time.  Do exercises as told  by your health care provider. Medicines  In addition to diet and lifestyle changes, your health care provider may recommend medicines to help lower cholesterol. This may be a medicine to lower the amount of cholesterol your liver makes. You may need medicine if: ? Diet and lifestyle changes do not lower your cholesterol enough. ? You have high cholesterol and other risk factors for heart disease or stroke.  Take over-the-counter and prescription medicines only as told by your health care provider. General information  Manage your risk factors for high cholesterol. Talk with your health care provider about all your risk factors and how to lower your risk.  Manage other conditions that you have, such as diabetes or high blood pressure (hypertension).  Have blood tests to check your cholesterol levels at regular points in time as told by your health care provider.  Keep all follow-up visits as told by your health care provider. This is important. Where to find more information  American Heart Association: www.heart.org  National Heart, Lung, and Blood Institute: www.nhlbi.nih.gov Summary  High cholesterol increases your risk for heart disease and stroke. By keeping your cholesterol level low, you can reduce your risk for these conditions.  High cholesterol can often be prevented with diet and lifestyle changes.  Work with your health care provider to manage your risk factors, and have your blood tested regularly. This information is not intended to replace advice given to you by your health care provider. Make sure you discuss any questions you have with your health care provider. Document Revised: 01/28/2019 Document Reviewed: 06/14/2016 Elsevier Patient Education  2020 Elsevier Inc.  

## 2020-03-29 NOTE — Assessment & Plan Note (Signed)
Noted on labs in July 2019, will recheck BMP today and assess.  If ongoing could consider addition of low dose ACE or ARB in future for kidney protection.  Recommend increase water intake at home and decrease Dr. Reino Kent.
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
## 2020-03-29 NOTE — Assessment & Plan Note (Signed)
Chronic, ongoing.  Recommend against using Claritin D due to elevation in BP noted with it on board today.  Instead recommend using Claritin or Allegra without the D on a daily basis + may trial OTC Flonase.  Appears recent cough is related to allergic rhinitis since is having benefit from antihistamine.  Adjust regimen as needed.
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
## 2020-03-30 LAB — BASIC METABOLIC PANEL
BUN/Creatinine Ratio: 14 (ref 10–24)
BUN: 21 mg/dL (ref 8–27)
CO2: 25 mmol/L (ref 20–29)
Calcium: 9.8 mg/dL (ref 8.6–10.2)
Chloride: 104 mmol/L (ref 96–106)
Creatinine, Ser: 1.51 mg/dL — ABNORMAL HIGH (ref 0.76–1.27)
GFR calc Af Amer: 54 mL/min/{1.73_m2} — ABNORMAL LOW (ref >59)
GFR calc non Af Amer: 47 mL/min/{1.73_m2} — ABNORMAL LOW (ref >59)
Glucose: 95 mg/dL (ref 65–99)
Potassium: 4.6 mmol/L (ref 3.5–5.2)
Sodium: 142 mmol/L (ref 134–144)

## 2020-03-30 NOTE — Progress Notes (Signed)
Contacted via MyChart  Good morning Nicholas Shah, your labs have returned.  You are continuing to show some mild kidney disease stage 3a.  Your creatinine is mildly elevated and GFR slightly low.  At next visit we will discuss adding on a low dose a a blood pressure medication that is kidney protective, this would be beneficial to keep kidneys happy.  I recommend focus at home on a low sodium diet and drinking plenty of water.  Any questions for me? Keep being awesome!! Kindest regards, Ashani Pumphrey

## 2020-05-17 ENCOUNTER — Other Ambulatory Visit: Payer: Self-pay | Admitting: Urology

## 2020-05-17 DIAGNOSIS — R972 Elevated prostate specific antigen [PSA]: Secondary | ICD-10-CM

## 2020-05-30 ENCOUNTER — Other Ambulatory Visit: Payer: Self-pay | Admitting: Urology

## 2020-05-30 DIAGNOSIS — R972 Elevated prostate specific antigen [PSA]: Secondary | ICD-10-CM

## 2020-06-18 ENCOUNTER — Other Ambulatory Visit: Payer: Self-pay

## 2020-06-18 ENCOUNTER — Ambulatory Visit
Admission: RE | Admit: 2020-06-18 | Discharge: 2020-06-18 | Disposition: A | Discharge status: Home / Self Care | Payer: Managed Care, Other (non HMO) | Source: Ambulatory Visit | Attending: Urology | Admitting: Urology

## 2020-06-18 DIAGNOSIS — R972 Elevated prostate specific antigen [PSA]: Secondary | ICD-10-CM | POA: Diagnosis not present

## 2020-06-18 MED ORDER — GADOBUTROL 1 MMOL/ML IV SOLN
7.5000 mL | Freq: Once | INTRAVENOUS | Status: AC | PRN
  Administered 2020-06-18: 7.5 mL via INTRAVENOUS

## 2020-07-10 ENCOUNTER — Other Ambulatory Visit: Payer: Self-pay

## 2020-07-10 ENCOUNTER — Encounter
Admission: RE | Admit: 2020-07-10 | Discharge: 2020-07-10 | Disposition: A | Discharge status: Home / Self Care | Payer: Managed Care, Other (non HMO) | Source: Ambulatory Visit | Attending: Urology | Admitting: Urology

## 2020-07-10 ENCOUNTER — Other Ambulatory Visit: Payer: Managed Care, Other (non HMO)

## 2020-07-10 NOTE — Patient Instructions (Signed)
Your procedure is scheduled on: Thursday July 19, 2020. Report to Day Surgery inside [place] 2nd floor. To find out your arrival time please call [phone] between 1PM - 3PM on Wednesday July 18, 2020.  Remember: Instructions that are not followed completely may result in serious medical risk,  up to and including death, or upon the discretion of your surgeon and anesthesiologist your  surgery may need to be rescheduled.     _X__ 1. Do not eat food after midnight the night before your procedure.                 No chewing gum or hard candies. You may drink clear liquids up to 2 hours                 before you are scheduled to arrive for your surgery- DO not drink clear                 liquids within 2 hours of the start of your surgery.                 Clear Liquids include:  water, apple juice without pulp, clear Gatorade, G2 or                  Gatorade Zero (avoid Red/Purple/Blue), Black Coffee or Tea (Do not add                 anything to coffee or tea).  __X__2.  On the morning of surgery brush your teeth with toothpaste and water, you                may rinse your mouth with mouthwash if you wish.  Do not swallow any toothpaste of mouthwash.     _X__ 3.  No Alcohol for 24 hours before or after surgery.   _X__ 4.  Do Not Smoke or use e-cigarettes For 24 Hours Prior to Your Surgery.                 Do not use any chewable tobacco products for at least 6 hours prior to                 Surgery.  _X__  5.  Do not use any recreational drugs (marijuana, cocaine, heroin, ecstasy, MDMA or other)                For at least one week prior to your surgery.  Combination of these drugs with anesthesia                May have life threatening results.  __x__ 6.  Notify your doctor if there is any change in your medical condition      (cold, fever, infections).     Do not wear jewelry, make-up, hairpins, clips or nail polish. Do not wear lotions,  powders, or perfumes. You may wear deodorant. Do not shave 48 hours prior to surgery. Men may shave face and neck. Do not bring valuables to the hospital.    [facility] is not responsible for any belongings or valuables.  Contacts, dentures or bridgework may not be worn into surgery. Leave your suitcase in the car. After surgery it may be brought to your room. For patients admitted to the hospital, discharge time is determined by your treatment team.   Patients discharged the day of surgery will not be allowed to drive home.   Make arrangements for someone to be  with you for the first 24 hours of your Same Day Discharge.   __x__ Take these medicines the morning of surgery with A SIP OF WATER:    1. None   __x__ Fleet Enema (as directed)   __x__ Stop Anti-inflammatories such as Ibuprofen, Aleve, Advil, naproxen, aspirin and or BC powders.    __x__ Stop supplements until after surgery.    __x__ Do not start any herbal supplements before your procedure.     If you have any questions regarding your pre-procedure instructions,  Please call Pre-admit Testing at [phone].
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
## 2020-07-17 ENCOUNTER — Encounter
Admission: RE | Admit: 2020-07-17 | Discharge: 2020-07-17 | Disposition: A | Discharge status: Home / Self Care | Payer: Managed Care, Other (non HMO) | Source: Ambulatory Visit | Attending: Urology | Admitting: Urology

## 2020-07-17 ENCOUNTER — Other Ambulatory Visit: Payer: Self-pay

## 2020-07-17 DIAGNOSIS — Z20822 Contact with and (suspected) exposure to covid-19: Secondary | ICD-10-CM | POA: Insufficient documentation

## 2020-07-17 DIAGNOSIS — Z01818 Encounter for other preprocedural examination: Secondary | ICD-10-CM | POA: Diagnosis not present

## 2020-07-17 LAB — SARS CORONAVIRUS 2 (TAT 6-24 HRS): SARS Coronavirus 2: NEGATIVE

## 2020-07-19 ENCOUNTER — Ambulatory Visit
Admission: RE | Admit: 2020-07-19 | Discharge: 2020-07-19 | Disposition: A | Discharge status: Home / Self Care | Payer: Managed Care, Other (non HMO) | Attending: Urology | Admitting: Urology

## 2020-07-19 ENCOUNTER — Encounter: Payer: Self-pay | Admitting: Urology

## 2020-07-19 ENCOUNTER — Ambulatory Visit: Payer: Managed Care, Other (non HMO) | Admitting: Registered Nurse

## 2020-07-19 ENCOUNTER — Other Ambulatory Visit: Payer: Self-pay

## 2020-07-19 ENCOUNTER — Encounter
Admission: RE | Disposition: A | Discharge status: Home / Self Care | Payer: Self-pay | Source: Home / Self Care | Attending: Urology

## 2020-07-19 DIAGNOSIS — N289 Disorder of kidney and ureter, unspecified: Secondary | ICD-10-CM | POA: Insufficient documentation

## 2020-07-19 DIAGNOSIS — R972 Elevated prostate specific antigen [PSA]: Secondary | ICD-10-CM | POA: Diagnosis present

## 2020-07-19 DIAGNOSIS — M1990 Unspecified osteoarthritis, unspecified site: Secondary | ICD-10-CM | POA: Insufficient documentation

## 2020-07-19 DIAGNOSIS — Z8042 Family history of malignant neoplasm of prostate: Secondary | ICD-10-CM | POA: Diagnosis not present

## 2020-07-19 HISTORY — PX: PROSTATE BIOPSY: SHX241

## 2020-07-19 SURGERY — BIOPSY, PROSTATE
Anesthesia: General

## 2020-07-19 MED ORDER — DEXAMETHASONE SODIUM PHOSPHATE 10 MG/ML IJ SOLN
INTRAMUSCULAR | Status: DC | PRN
  Administered 2020-07-19: 10 mg via INTRAVENOUS

## 2020-07-19 MED ORDER — SEVOFLURANE IN SOLN
RESPIRATORY_TRACT | Status: AC
  Filled 2020-07-19: qty 250

## 2020-07-19 MED ORDER — PHENYLEPHRINE HCL (PRESSORS) 10 MG/ML IV SOLN
INTRAVENOUS | Status: DC | PRN
  Administered 2020-07-19 (×2): 100 ug via INTRAVENOUS

## 2020-07-19 MED ORDER — GENTAMICIN IN SALINE 1.6-0.9 MG/ML-% IV SOLN
80.0000 mg | Freq: Once | INTRAVENOUS | Status: AC
  Administered 2020-07-19: 80 mg via INTRAVENOUS
  Filled 2020-07-19 (×2): qty 50

## 2020-07-19 MED ORDER — FAMOTIDINE 20 MG PO TABS
ORAL_TABLET | ORAL | Status: AC
  Filled 2020-07-19: qty 1

## 2020-07-19 MED ORDER — ACETAMINOPHEN 10 MG/ML IV SOLN
INTRAVENOUS | Status: AC
  Filled 2020-07-19: qty 100

## 2020-07-19 MED ORDER — FAMOTIDINE 20 MG PO TABS: 20.0000 mg | ORAL_TABLET | Freq: Once | ORAL | Status: AC

## 2020-07-19 MED ORDER — FENTANYL CITRATE (PF) 100 MCG/2ML IJ SOLN: 25.0000 ug | INTRAMUSCULAR | Status: DC | PRN

## 2020-07-19 MED ORDER — CEFAZOLIN SODIUM-DEXTROSE 1-4 GM/50ML-% IV SOLN
INTRAVENOUS | Status: AC
  Filled 2020-07-19: qty 50

## 2020-07-19 MED ORDER — CHLORHEXIDINE GLUCONATE 0.12 % MT SOLN: 15.0000 mL | Freq: Once | OROMUCOSAL | Status: AC

## 2020-07-19 MED ORDER — CHLORHEXIDINE GLUCONATE 0.12 % MT SOLN
OROMUCOSAL | Status: AC
  Administered 2020-07-19: 15 mL via OROMUCOSAL
  Filled 2020-07-19: qty 15

## 2020-07-19 MED ORDER — FENTANYL CITRATE (PF) 100 MCG/2ML IJ SOLN
INTRAMUSCULAR | Status: AC
  Filled 2020-07-19: qty 2

## 2020-07-19 MED ORDER — PROPOFOL 10 MG/ML IV BOLUS
INTRAVENOUS | Status: DC | PRN
  Administered 2020-07-19: 150 mg via INTRAVENOUS

## 2020-07-19 MED ORDER — CEFAZOLIN SODIUM-DEXTROSE 1-4 GM/50ML-% IV SOLN
1.0000 g | Freq: Once | INTRAVENOUS | Status: AC
  Administered 2020-07-19: 1 g via INTRAVENOUS

## 2020-07-19 MED ORDER — FAMOTIDINE 20 MG PO TABS
ORAL_TABLET | ORAL | Status: AC
  Administered 2020-07-19: 20 mg via ORAL
  Filled 2020-07-19: qty 1

## 2020-07-19 MED ORDER — FENTANYL CITRATE (PF) 100 MCG/2ML IJ SOLN
INTRAMUSCULAR | Status: DC | PRN
Start: 2020-07-19 — End: 2020-07-19
  Administered 2020-07-19: 50 ug via INTRAVENOUS

## 2020-07-19 MED ORDER — PROPOFOL 10 MG/ML IV BOLUS
INTRAVENOUS | Status: AC
  Filled 2020-07-19: qty 40

## 2020-07-19 MED ORDER — FLEET ENEMA 7-19 GM/118ML RE ENEM
1.0000 | ENEMA | Freq: Once | RECTAL | Status: AC
  Administered 2020-07-19: 1 via RECTAL

## 2020-07-19 MED ORDER — EPHEDRINE SULFATE 50 MG/ML IJ SOLN
INTRAMUSCULAR | Status: DC | PRN
  Administered 2020-07-19: 5 mg via INTRAVENOUS
  Administered 2020-07-19: 10 mg via INTRAVENOUS
  Administered 2020-07-19: 15 mg via INTRAVENOUS

## 2020-07-19 MED ORDER — ACETAMINOPHEN 10 MG/ML IV SOLN
INTRAVENOUS | Status: DC | PRN
  Administered 2020-07-19: 1000 mg via INTRAVENOUS

## 2020-07-19 MED ORDER — SODIUM CHLORIDE 0.9 % IV SOLN: INTRAVENOUS | Status: DC

## 2020-07-19 MED ORDER — ONDANSETRON HCL 4 MG/2ML IJ SOLN
INTRAMUSCULAR | Status: DC | PRN
  Administered 2020-07-19: 4 mg via INTRAVENOUS

## 2020-07-19 MED ORDER — LEVOFLOXACIN 500 MG PO TABS: 500.0000 mg | ORAL_TABLET | Freq: Every day | ORAL | 0 refills | Status: DC

## 2020-07-19 MED ORDER — ONDANSETRON HCL 4 MG/2ML IJ SOLN: 4.0000 mg | Freq: Once | INTRAMUSCULAR | Status: DC | PRN

## 2020-07-19 MED ORDER — ORAL CARE MOUTH RINSE: 15.0000 mL | Freq: Once | OROMUCOSAL | Status: AC

## 2020-07-19 MED ORDER — LIDOCAINE HCL (CARDIAC) PF 100 MG/5ML IV SOSY
PREFILLED_SYRINGE | INTRAVENOUS | Status: DC | PRN
  Administered 2020-07-19: 80 mg via INTRAVENOUS

## 2020-07-19 SURGICAL SUPPLY — 19 items
COVER MAYO STAND REUSABLE (DRAPES) ×2 IMPLANT
COVER TRANSDUCER ULTRASOUND (MISCELLANEOUS) ×1 IMPLANT
COVER WAND RF STERILE (DRAPES) ×2 IMPLANT
GLOVE BIO SURGEON STRL SZ7 (GLOVE) ×4 IMPLANT
GUIDE NDL ENDOCAV 16-18 CVR (NEEDLE) IMPLANT
GUIDE NDL URONAV ULTRASND S (MISCELLANEOUS) IMPLANT
GUIDE NEEDLE ENDOCAV 16-18 CVR (NEEDLE) IMPLANT
GUIDE NEEDLE URONAV ULTRASND S (MISCELLANEOUS) ×1 IMPLANT
INST BIOPSY MAXCORE 18GX25 (NEEDLE) ×2 IMPLANT
NDL GUIDE BIOPSY 644068 (NEEDLE) IMPLANT
NEEDLE GUIDE BIOPSY 644068 (NEEDLE) IMPLANT
PROBE BIOSP ALOKA ALPHA6 PROST (MISCELLANEOUS) ×1 IMPLANT
PROBE URONAV BK 8808E 8818 HLD (MISCELLANEOUS) IMPLANT
SURGILUBE 2OZ TUBE FLIPTOP (MISCELLANEOUS) ×2 IMPLANT
TOWEL OR 17X26 4PK STRL BLUE (TOWEL DISPOSABLE) ×2 IMPLANT
URONAV BK 8808E 8818 PROBE HLD (MISCELLANEOUS) ×2
URONAV MRI FUSION TWO PATIENTS (MISCELLANEOUS) ×1 IMPLANT
URONAV ULTRASOUND (MISCELLANEOUS) ×1 IMPLANT
URONAV ULTRASOUND NDL GUIDE S (MISCELLANEOUS) ×2

## 2020-07-19 NOTE — Anesthesia Procedure Notes (Signed)
Procedure Name: LMA Insertion Date/Time: 07/19/2020 7:39 AM Performed by: Reece Agar, CRNA Pre-anesthesia Checklist: Patient identified, Emergency Drugs available, Suction available and Patient being monitored Patient Re-evaluated:Patient Re-evaluated prior to induction Oxygen Delivery Method: Circle system utilized Preoxygenation: Pre-oxygenation with 100% oxygen Induction Type: IV induction Ventilation: Mask ventilation without difficulty LMA: LMA inserted LMA Size: 4.0 Tube type: Oral Number of attempts: 1 Placement Confirmation: breath sounds checked- equal and bilateral and positive ETCO2 Tube secured with: Tape Dental Injury: Teeth and Oropharynx as per pre-operative assessment

## 2020-07-19 NOTE — Anesthesia Postprocedure Evaluation (Signed)
Anesthesia Post Note  Patient: Nicholas Shah  Procedure(s) Performed: PROSTATE BIOPSY URONAV FUSION (N/A )  Patient location during evaluation: PACU Anesthesia Type: General Level of consciousness: awake and alert and oriented Pain management: pain level controlled Vital Signs Assessment: post-procedure vital signs reviewed and stable Respiratory status: spontaneous breathing Cardiovascular status: blood pressure returned to baseline Anesthetic complications: no   No complications documented.   Last Vitals:  Vitals:   07/19/20 0905 07/19/20 0906  BP: (!) 144/90 (!) 144/90  Pulse: 75   Resp: 16   Temp: 36.6 C   SpO2: 99% 100%    Last Pain:  Vitals:   07/19/20 0906  TempSrc:   PainSc: 0-No pain                 Mikai Meints

## 2020-07-19 NOTE — Transfer of Care (Signed)
Immediate Anesthesia Transfer of Care Note  Patient: Nicholas Shah  Procedure(s) Performed: PROSTATE BIOPSY Michell Heinrich (N/A )  Patient Location: PACU  Anesthesia Type:General  Level of Consciousness: drowsy and patient cooperative  Airway & Oxygen Therapy: Patient Spontanous Breathing  Post-op Assessment: Report given to RN and Post -op Vital signs reviewed and stable  Post vital signs: Reviewed and stable  Last Vitals:  Vitals Value Taken Time  BP 133/80 07/19/20 0821  Temp 35.9 C 07/19/20 0820  Pulse 79 07/19/20 0827  Resp 12 07/19/20 0827  SpO2 97 % 07/19/20 0827    Last Pain:  Vitals:   07/19/20 0820  TempSrc:   PainSc: 0-No pain         Complications: No complications documented.

## 2020-07-19 NOTE — Op Note (Signed)
Preoperative diagnosis: 1.  Elevated PSA (R97.2)                                             2.  PI-RADS category 4 lesions of the prostate (D40.0)  Postoperative diagnosis: Same  Procedure: Uronav fusion biopsy of the prostate CPT 55700, [phone])  Surgeon: Suszanne Conners. Evelene Croon MD  Anesthesia: General  Indications:See the history and physical. After informed consent the above procedure(s) were requested     Technique and findings: After adequate general anesthesia had been obtained the patient was placed into the left lateral decubitus position.  Digital sweep of the rectal vault indicated that the rectal vault was clear.  The transrectal ultrasound probe was then placed and ultrasound images acquired.  Ultrasound images were then fused with the MRI images.  Region of interest #1 was identified and 3 core biopsies taken from this area.  Region of interest #2 was identified and 3 core biopsies taken from this area.  At this point standard 12 core systematic biopsies were performed.  The ultrasound probe was then removed and procedure was terminated.  The loss was minimal.  Patient was then transferred to the recovery room in stable condition.

## 2020-07-19 NOTE — Progress Notes (Signed)
   07/19/20 0745  Clinical Encounter Type  Visited With Family  Visit Type Initial  Referral From Chaplain  Consult/Referral To Chaplain  While rounding [facility] waiting area, chaplain briefly visited with Pt's sister. Pt's sister said he is having a biopsy done. Sister also said, "keep [place] all in prayer. We need it." Chaplain wished her well. Everything chaplain came by, Pt's sister waved and smile.

## 2020-07-19 NOTE — Anesthesia Preprocedure Evaluation (Signed)
Anesthesia Evaluation  Patient identified by MRN, date of birth, ID band Patient awake    Reviewed: Allergy & Precautions, NPO status , Patient's Chart, lab work & pertinent test results  Airway Mallampati: II  TM Distance: >3 FB     Dental  (+) Teeth Intact   Pulmonary neg pulmonary ROS,    Pulmonary exam normal        Cardiovascular negative cardio ROS Normal cardiovascular exam     Neuro/Psych negative neurological ROS  negative psych ROS   GI/Hepatic negative GI ROS, Neg liver ROS,   Endo/Other  negative endocrine ROS  Renal/GU Renal InsufficiencyRenal disease  negative genitourinary   Musculoskeletal  (+) Arthritis , Osteoarthritis,    Abdominal Normal abdominal exam  (+)   Peds negative pediatric ROS (+)  Hematology negative hematology ROS (+)   Anesthesia Other Findings   Reproductive/Obstetrics                             Anesthesia Physical  Anesthesia Plan  ASA: II  Anesthesia Plan: General   Post-op Pain Management:    Induction: Intravenous  PONV Risk Score and Plan:   Airway Management Planned: LMA and Oral ETT  Additional Equipment:   Intra-op Plan:   Post-operative Plan: Extubation in OR  Informed Consent: I have reviewed the patients History and Physical, chart, labs and discussed the procedure including the risks, benefits and alternatives for the proposed anesthesia with the patient or authorized representative who has indicated his/her understanding and acceptance.     Dental advisory given  Plan Discussed with: Surgeon and CRNA  Anesthesia Plan Comments:         Anesthesia Quick Evaluation

## 2020-07-19 NOTE — H&P (Signed)
Date of Initial H&P:07/10/20  History reviewed, patient examined, no change in status, stable for surgery. 

## 2020-07-19 NOTE — Discharge Instructions (Addendum)
Prostate-Specific Antigen Test Why am I having this test? The prostate-specific antigen (PSA) test is a screening test for prostate cancer. It can identify early signs of prostate cancer, which may allow for more effective treatment. Your health care provider may recommend that you have a PSA test starting at age 68 or that you have one earlier or later, depending on your risk factors for prostate cancer. You may also have a PSA test:  To monitor treatment of prostate cancer.  To check whether prostate cancer has returned after treatment.  If you have signs of other conditions that can affect PSA levels, such as: ? An enlarged prostate that is not caused by cancer (benign prostatic hyperplasia, BPH). This condition is very common in older men. ? A prostate infection. What is being tested? This test measures the amount of PSA in your blood. PSA is a protein that is made in the prostate. The prostate naturally produces more PSA as you age, but very high levels may be a sign of a medical condition. What kind of sample is taken?  A blood sample is required for this test. It is usually collected by inserting a needle into a blood vessel or by sticking a finger with a small needle. Blood for this test should be drawn before having an exam of the prostate. How do I prepare for this test? Do not ejaculate starting 24 hours before your test, or as long as told by your health care provider. Tell a health care provider about:  Any allergies you have.  All medicines you are taking, including vitamins, herbs, eye drops, creams, and over-the-counter medicines. This also includes: ? Medicines to assist with hair growth, such as finasteride. ? Any recent exposure to a medicine called diethylstilbestrol.  Any blood disorders you have.  Any recent procedures you have had, especially any procedures involving the prostate or rectum.  Any medical conditions you have.  Any recent urinary tract infections  (UTIs) you have had. How are the results reported? Your test results will be reported as a value that indicates how much PSA is in your blood. This will be given as nanograms of PSA per milliliter of blood (ng/mL). Your health care provider will compare your results to normal ranges that were established after testing a large group of people (reference ranges). Reference ranges may vary among labs and hospitals. PSA levels vary from person to person and generally increase with age. Because of this variation, there is no single PSA value that is considered normal for everyone. Instead, PSA reference ranges are used to describe whether your PSA levels are considered low or high (elevated). Common reference ranges are:  Low: 0-2.5 ng/mL.  Slightly to moderately elevated: 2.6-10.0 ng/mL.  Moderately elevated: 10.0-19.9 ng/mL.  Significantly elevated: 20 ng/mL or greater. Sometimes, the test results may report that a condition is present when it is not present (false-positive result). What do the results mean? A test result that is higher than 4 ng/mL may mean that you are at an increased risk for prostate cancer. However, a PSA test by itself is not enough to diagnose prostate cancer. High PSA levels may also be caused by the natural aging process, prostate infection, or BPH. PSA screening cannot tell you if your PSA is high due to cancer or a different cause. A prostate biopsy is the only way to diagnose prostate cancer. A risk of having the PSA test is diagnosing and treating prostate cancer that would never have caused any   symptoms or problems (overdiagnosis and overtreatment). Talk with your health care provider about what your results mean. Questions to ask your health care provider Ask your health care provider, or the department that is doing the test:  When will my results be ready?  How will I get my results?  What are my treatment options?  What other tests do I need?  What are my  next steps? Summary  The prostate-specific antigen (PSA) test is a screening test for prostate cancer.  Your health care provider may recommend that you have a PSA test starting at age 68 or that you have one earlier or later, depending on your risk factors for prostate cancer.  A test result that is higher than 4 ng/mL may mean that you are at an increased risk for prostate cancer. However, elevated levels can be caused by a number of conditions other than prostate cancer.  Talk with your health care provider about what your results mean. This information is not intended to replace advice given to you by your health care provider. Make sure you discuss any questions you have with your health care provider. Document Revised: 09/18/2017 Document Reviewed: 07/13/2017 Elsevier Patient Education  [place]. Transrectal Ultrasound-Guided Prostate Biopsy, Care After This sheet gives you information about how to care for yourself after your procedure. Your doctor may also give you more specific instructions. If you have problems or questions, contact your doctor. What can I expect after the procedure? After the procedure, it is common to have:  Pain and discomfort in your butt, especially while sitting.  Pink-colored pee (urine), due to small amounts of blood in the pee.  Burning while peeing (urinating).  Blood in your poop (stool).  Bleeding from your butt.  Blood in your semen. Follow these instructions at home: Medicines  Take over-the-counter and prescription medicines only as told by your doctor.  If you were prescribed antibiotic medicine, take it as told by your doctor. Do not stop taking the antibiotic even if you start to feel better. Activity   Do not drive for 24 hours if you were given a medicine to help you relax (sedative) during your procedure.  Return to your normal activities as told by your doctor. Ask your doctor what activities are safe for you.  Ask  your doctor when it is okay for you to have sex.  Do not lift anything that is heavier than 10 lb (4.5 kg), or the limit that you are told, until your doctor says that it is safe. General instructions   Drink enough water to keep your pee pale yellow.  Watch your pee, poop, and semen for new bleeding or bleeding that gets worse.  Keep all follow-up visits as told by your doctor. This is important. Contact a doctor if you:  Have blood clots in your pee or poop.  Notice that your pee smells bad or unusual.  Have very bad belly pain.  Have trouble peeing.  Notice that your lower belly feels firm.  Have blood in your pee for more than 2 weeks after the procedure.  Have blood in your semen for more than 2 months after the procedure.  Have problems getting an erection.  Feel sick to your stomach (nauseous).  Throw up (vomit).  Have new or worse bleeding in your pee, poop, or semen. Get help right away if you:  Have a fever or chills.  Have bright red pee.  Have very bad pain that does not  get better with medicine.  Cannot pee. Summary  After this procedure, it is common to have pain and discomfort around your butt, especially while sitting.  You may have blood in your pee and poop.  It is common to have blood in your semen for 1-2 months.  If you were prescribed antibiotic medicine, take it as told by your doctor. Do not stop taking the antibiotic even if you start to feel better.  Get help right away if you have a fever or chills. This information is not intended to replace advice given to you by your health care provider. Make sure you discuss any questions you have with your health care provider. Document Revised: 01/26/2019 Document Reviewed: 08/04/2017 Elsevier Patient Education  2020 [employer].

## 2020-07-20 LAB — SURGICAL PATHOLOGY

## 2020-07-27 ENCOUNTER — Other Ambulatory Visit: Payer: Self-pay

## 2020-07-27 ENCOUNTER — Ambulatory Visit: Payer: Managed Care, Other (non HMO)

## 2020-07-27 DIAGNOSIS — Z23 Encounter for immunization: Secondary | ICD-10-CM | POA: Diagnosis not present

## 2020-08-08 IMAGING — CR DG CHEST 2V
1 series · 2 of 2 positions shown · non-contrast
Comparison: April 06, 2014.

CLINICAL DATA: Chronic cough

EXAM:
CHEST - 2 VIEW

[Series 1: dg chest 2 view · 0.14mm/px · 2 of 2 slices shown]
[im 1/2]
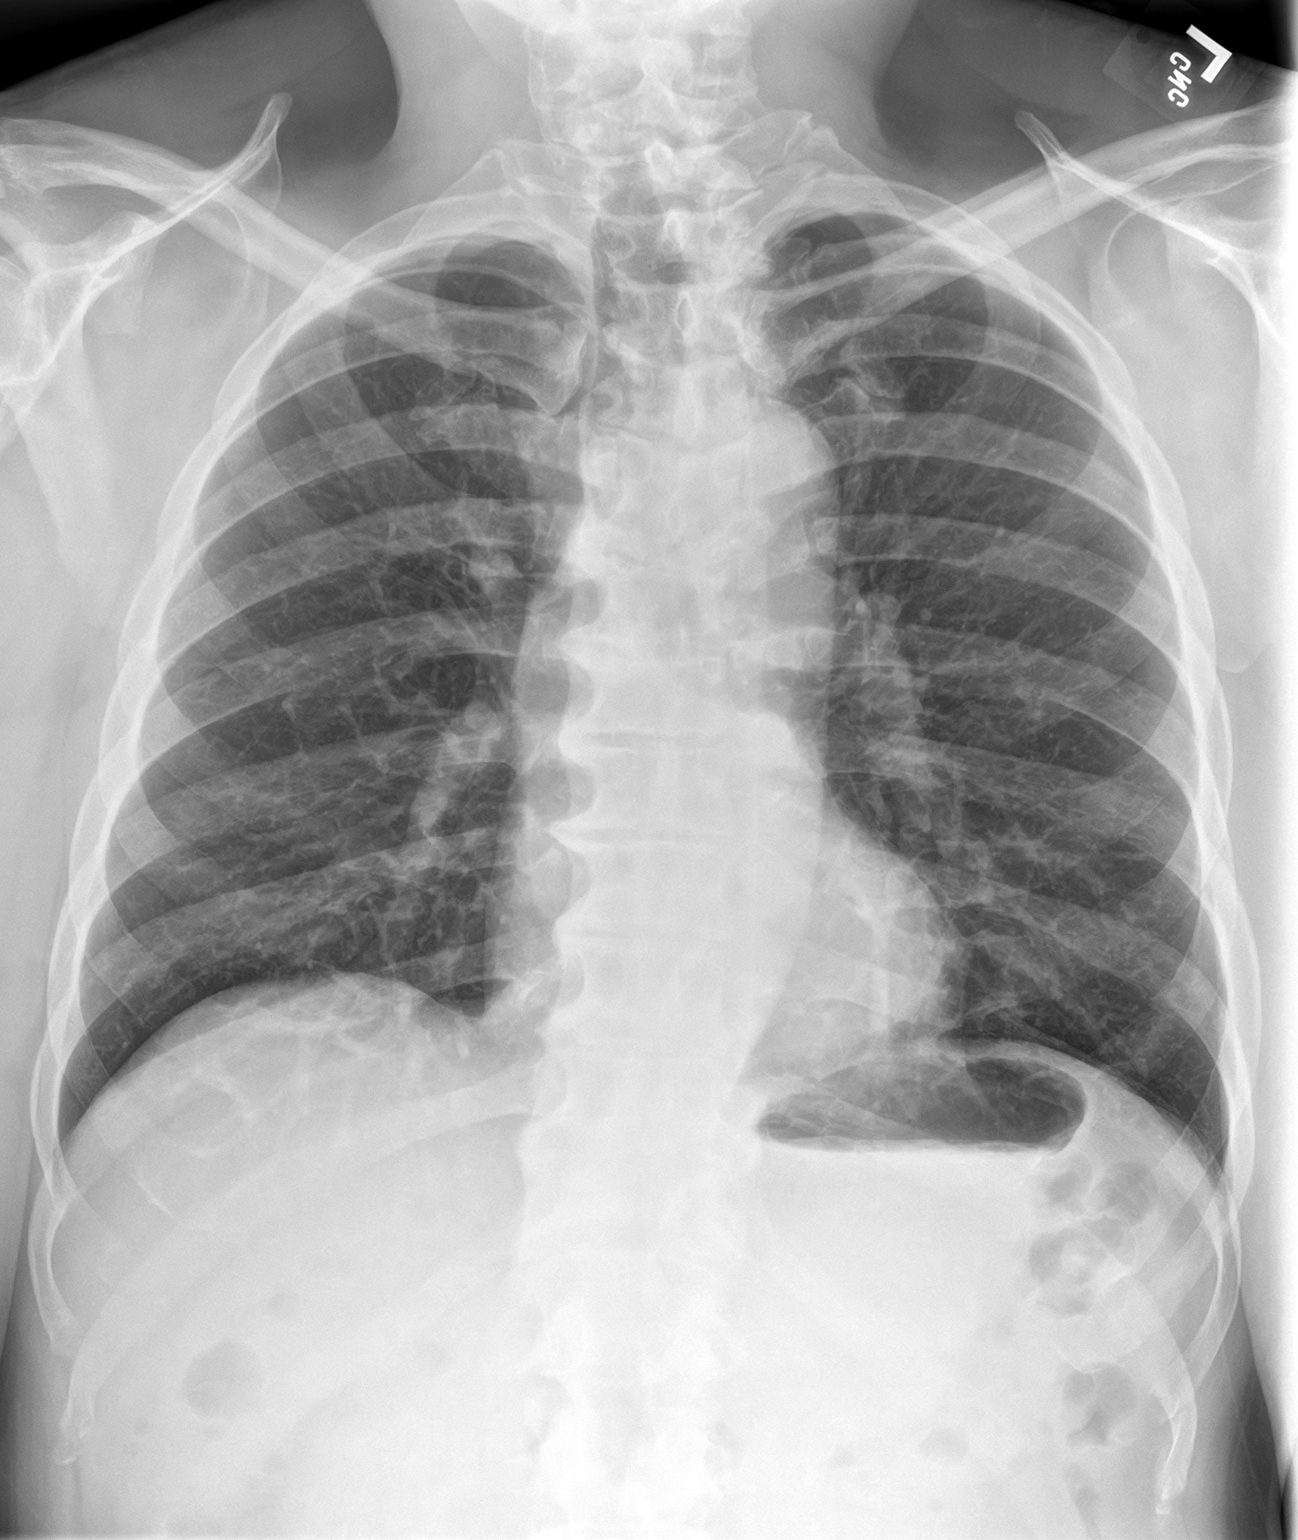
[im 2/2]
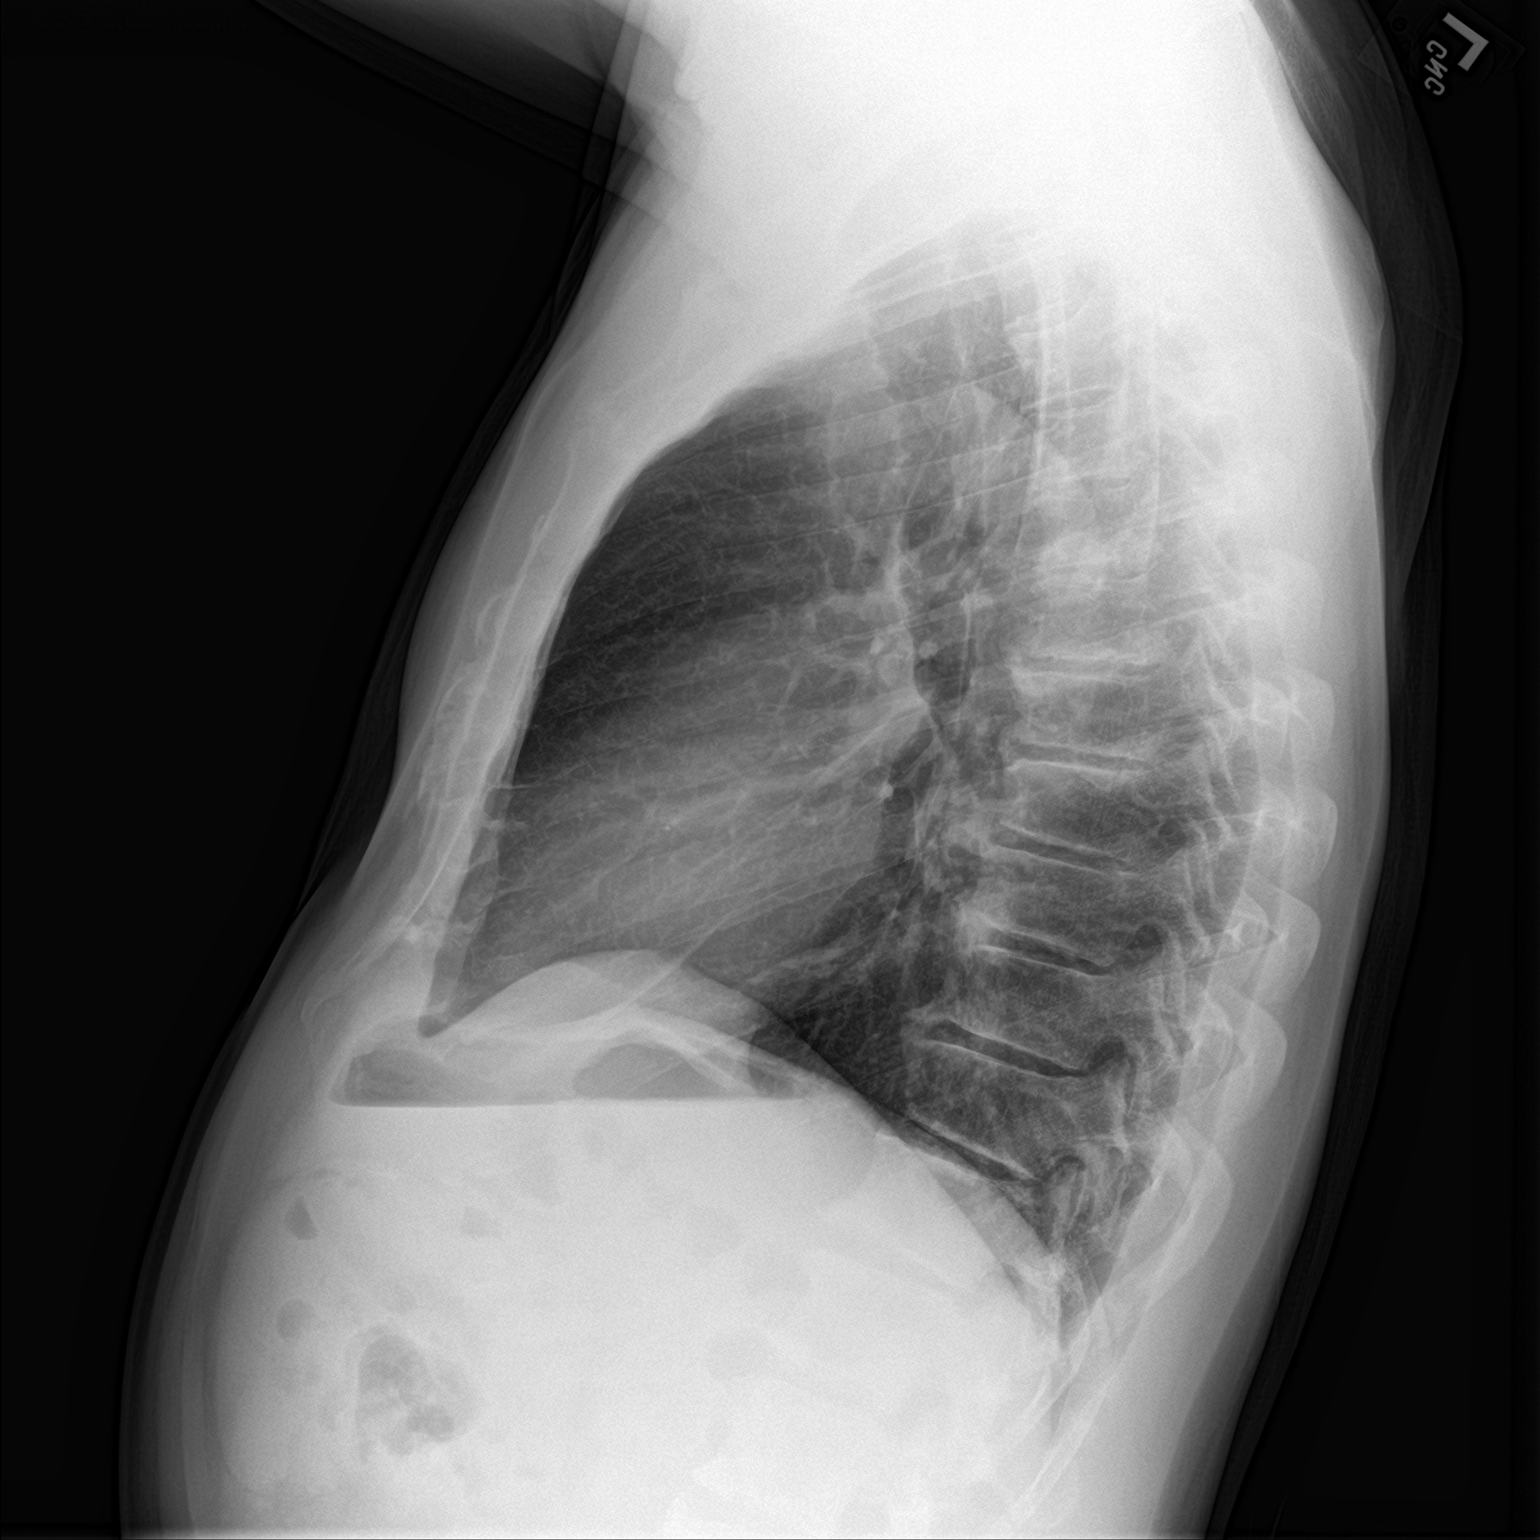

[2 of 2 positions shown; findings below may reference images not displayed]

FINDINGS: Lungs are clear. Heart size and pulmonary vascularity are normal. No
adenopathy. There is degenerative change in the thoracic spine.
IMPRESSION: Lungs clear.  Cardiac silhouette within normal limits.

## 2020-09-28 ENCOUNTER — Ambulatory Visit: Payer: 59 | Admitting: Nurse Practitioner

## 2020-10-25 ENCOUNTER — Encounter: Payer: Self-pay | Admitting: Unknown Physician Specialty

## 2020-10-25 ENCOUNTER — Ambulatory Visit (INDEPENDENT_AMBULATORY_CARE_PROVIDER_SITE_OTHER): Payer: Managed Care, Other (non HMO) | Admitting: Unknown Physician Specialty

## 2020-10-25 ENCOUNTER — Other Ambulatory Visit: Payer: Self-pay

## 2020-10-25 DIAGNOSIS — E7800 Pure hypercholesterolemia, unspecified: Secondary | ICD-10-CM

## 2020-10-25 DIAGNOSIS — I10 Essential (primary) hypertension: Secondary | ICD-10-CM

## 2020-10-25 MED ORDER — LISINOPRIL 5 MG PO TABS: 5.0000 mg | ORAL_TABLET | Freq: Every day | ORAL | 3 refills | Status: DC

## 2020-10-25 NOTE — Assessment & Plan Note (Signed)
New diagnosis with CKD.  Check CMP.  Start Lisinopril 5 mg Recheck 1 week

## 2020-10-25 NOTE — Progress Notes (Signed)
BP 138/88   Pulse 86   Temp 98 F (36.7 C) (Oral)   Ht 5' 7.64" (1.718 m)   Wt 164 lb 3.2 oz (74.5 kg)   SpO2 98%   BMI 25.23 kg/m    Subjective:    Patient ID: Nicholas Shah, male    DOB: 08-23-1951, 70 y.o.   MRN: [id number]  HPI: [name] is a 70 y.o. male  Chief Complaint  Patient presents with  . Hyperlipidemia  . Hypertension   Hypertension Using medications without difficulty Average home BPs   No problems or lightheadedness No chest pain with exertion or shortness of breath No Edema   Hyperlipidemia Not currently taking medications.  Strong family history of heart disease.   The 10-year ASCVD risk score Denman George DC Montez Hageman., et al., 2013) is: 19.5%   Values used to calculate the score:     Age: 18 years     Sex: Male     Is Non-Hispanic African American: Yes     Diabetic: No     Tobacco smoker: No     Systolic Blood Pressure: 138 mmHg     Is BP treated: Yes     HDL Cholesterol: 68 mg/dL     Total Cholesterol: 238 mg/dL   Relevant past medical, surgical, family and social history reviewed and updated as indicated. Interim medical history since our last visit reviewed. Allergies and medications reviewed and updated.  Review of Systems  Per HPI unless specifically indicated above     Objective:    BP 138/88   Pulse 86   Temp 98 F (36.7 C) (Oral)   Ht 5' 7.64" (1.718 m)   Wt 164 lb 3.2 oz (74.5 kg)   SpO2 98%   BMI 25.23 kg/m   Wt Readings from Last 3 Encounters:  10/25/20 164 lb 3.2 oz (74.5 kg)  07/19/20 165 lb (74.8 kg)  07/10/20 162 lb (73.5 kg)    Physical Exam Constitutional:      General: He is not in acute distress.    Appearance: Normal appearance. He is well-developed and well-nourished.  HENT:     Head: Normocephalic and atraumatic.  Eyes:     General: Lids are normal. No scleral icterus.       Right eye: No discharge.        Left eye: No discharge.     Conjunctiva/sclera: Conjunctivae normal.  Neck:     Vascular:  No carotid bruit or JVD.  Cardiovascular:     Rate and Rhythm: Normal rate and regular rhythm.     Heart sounds: Normal heart sounds.  Pulmonary:     Effort: Pulmonary effort is normal. No respiratory distress.     Breath sounds: Normal breath sounds.  Abdominal:     Palpations: There is no hepatomegaly or splenomegaly.  Musculoskeletal:        General: Normal range of motion.     Cervical back: Normal range of motion and neck supple.  Skin:    General: Skin is warm, dry and intact.     Coloration: Skin is not pale.     Findings: No rash.  Neurological:     Mental Status: He is alert and oriented to person, place, and time.  Psychiatric:        Mood and Affect: Mood and affect normal.        Behavior: Behavior normal.        Thought Content: Thought content normal.  Judgment: Judgment normal.      Assessment & Plan:   Problem List Items Addressed This Visit      Unprioritized   Elevated LDL cholesterol level    ASCVD 12%.  Heart disease in family.  Will start statin if continues high as heart disease in family      Relevant Orders   Comprehensive metabolic panel   Lipid Panel w/o Chol/HDL Ratio   Essential hypertension    New diagnosis with CKD.  Check CMP.  Start Lisinopril 5 mg Recheck 1 week      Relevant Medications   lisinopril (ZESTRIL) 5 MG tablet   Other Relevant Orders   Comprehensive metabolic panel   Lipid Panel w/o Chol/HDL Ratio   Basic Metabolic Panel (BMET)      Recheck BMP one week and office visit 1 month  Follow up plan: Return in about 4 weeks (around 11/22/2020).

## 2020-10-25 NOTE — Assessment & Plan Note (Signed)
ASCVD 12%.  Heart disease in family.  Will start statin if continues high as heart disease in family

## 2020-10-26 ENCOUNTER — Other Ambulatory Visit: Payer: Self-pay | Admitting: Unknown Physician Specialty

## 2020-10-26 LAB — COMPREHENSIVE METABOLIC PANEL
ALT: 13 IU/L (ref 0–44)
AST: 18 IU/L (ref 0–40)
Albumin/Globulin Ratio: 2 (ref 1.2–2.2)
Albumin: 4.7 g/dL (ref 3.8–4.8)
Alkaline Phosphatase: 71 IU/L (ref 44–121)
BUN/Creatinine Ratio: 12 (ref 10–24)
BUN: 17 mg/dL (ref 8–27)
Bilirubin Total: 0.5 mg/dL (ref 0.0–1.2)
CO2: 28 mmol/L (ref 20–29)
Calcium: 9.7 mg/dL (ref 8.6–10.2)
Chloride: 105 mmol/L (ref 96–106)
Creatinine, Ser: 1.41 mg/dL — ABNORMAL HIGH (ref 0.76–1.27)
GFR calc Af Amer: 59 mL/min/{1.73_m2} — ABNORMAL LOW (ref >59)
GFR calc non Af Amer: 51 mL/min/{1.73_m2} — ABNORMAL LOW (ref >59)
Globulin, Total: 2.3 g/dL (ref 1.5–4.5)
Glucose: 81 mg/dL (ref 65–99)
Potassium: 4.5 mmol/L (ref 3.5–5.2)
Sodium: 145 mmol/L — ABNORMAL HIGH (ref 134–144)
Total Protein: 7 g/dL (ref 6.0–8.5)

## 2020-10-26 LAB — LIPID PANEL W/O CHOL/HDL RATIO
Cholesterol, Total: 257 mg/dL — ABNORMAL HIGH (ref 100–199)
HDL: 71 mg/dL (ref >39)
LDL Chol Calc (NIH): 154 mg/dL — ABNORMAL HIGH (ref 0–99)
Triglycerides: 180 mg/dL — ABNORMAL HIGH (ref 0–149)
VLDL Cholesterol Cal: 32 mg/dL (ref 5–40)

## 2020-10-26 MED ORDER — ATORVASTATIN CALCIUM 10 MG PO TABS: 10.0000 mg | ORAL_TABLET | Freq: Every day | ORAL | 3 refills | Status: DC

## 2020-11-02 ENCOUNTER — Other Ambulatory Visit: Payer: Self-pay

## 2020-11-02 ENCOUNTER — Other Ambulatory Visit: Payer: Managed Care, Other (non HMO)

## 2020-11-02 DIAGNOSIS — I10 Essential (primary) hypertension: Secondary | ICD-10-CM

## 2020-11-03 LAB — BASIC METABOLIC PANEL
BUN/Creatinine Ratio: 14 (ref 10–24)
BUN: 20 mg/dL (ref 8–27)
CO2: 25 mmol/L (ref 20–29)
Calcium: 9.6 mg/dL (ref 8.6–10.2)
Chloride: 105 mmol/L (ref 96–106)
Creatinine, Ser: 1.42 mg/dL — ABNORMAL HIGH (ref 0.76–1.27)
GFR calc Af Amer: 58 mL/min/{1.73_m2} — ABNORMAL LOW (ref >59)
GFR calc non Af Amer: 50 mL/min/{1.73_m2} — ABNORMAL LOW (ref >59)
Glucose: 91 mg/dL (ref 65–99)
Potassium: 4.8 mmol/L (ref 3.5–5.2)
Sodium: 144 mmol/L (ref 134–144)

## 2020-11-23 ENCOUNTER — Ambulatory Visit (INDEPENDENT_AMBULATORY_CARE_PROVIDER_SITE_OTHER): Payer: Managed Care, Other (non HMO) | Admitting: Nurse Practitioner

## 2020-11-23 ENCOUNTER — Encounter: Payer: Self-pay | Admitting: Nurse Practitioner

## 2020-11-23 ENCOUNTER — Other Ambulatory Visit: Payer: Self-pay

## 2020-11-23 ENCOUNTER — Telehealth: Payer: Self-pay

## 2020-11-23 VITALS — BP 120/74 | HR 94 | Temp 98.3°F | Ht 67.6 in | Wt 168.0 lb

## 2020-11-23 DIAGNOSIS — I10 Essential (primary) hypertension: Secondary | ICD-10-CM | POA: Diagnosis not present

## 2020-11-23 DIAGNOSIS — J301 Allergic rhinitis due to pollen: Secondary | ICD-10-CM | POA: Diagnosis not present

## 2020-11-23 DIAGNOSIS — N1831 Chronic kidney disease, stage 3a: Secondary | ICD-10-CM

## 2020-11-23 DIAGNOSIS — E782 Mixed hyperlipidemia: Secondary | ICD-10-CM

## 2020-11-23 MED ORDER — ATORVASTATIN CALCIUM 10 MG PO TABS
10.0000 mg | ORAL_TABLET | Freq: Every day | ORAL | 3 refills | Status: DC
Start: 2020-11-23 — End: 2021-08-23

## 2020-11-23 MED ORDER — LORATADINE 10 MG PO TABS: 10.0000 mg | ORAL_TABLET | Freq: Every day | ORAL | 0 refills | Status: DC

## 2020-11-23 MED ORDER — FLUTICASONE PROPIONATE 50 MCG/ACT NA SUSP: 2.0000 | Freq: Every day | NASAL | 6 refills | Status: DC

## 2020-11-23 NOTE — Assessment & Plan Note (Signed)
Ongoing with much improvement with addition of Lisinopril.  Continue this dosing at this time and adjust as needed, monitor closely as has chronic underlying cough and may benefit from change to Losartan in future if increased cough presents.  BMP today.  Recommend she monitor BP at least a few mornings a week at home and document.  DASH diet at home.  Return in 8 weeks.

## 2020-11-23 NOTE — Patient Instructions (Signed)
Preventing High Cholesterol Cholesterol is a white, waxy substance similar to fat that the human body needs to help build cells. The liver makes all the cholesterol that a person's body needs. Having high cholesterol (hypercholesterolemia) increases your risk for heart disease and stroke. Extra or excess cholesterol comes from the food that you eat. High cholesterol can often be prevented with diet and lifestyle changes. If you already have high cholesterol, you can control it with diet, lifestyle changes, and medicines. How can high cholesterol affect me? If you have high cholesterol, fatty deposits (plaques) may build up on the walls of your blood vessels. The blood vessels that carry blood away from your heart are called arteries. Plaques make the arteries narrower and stiffer. This in turn can:  Restrict or block blood flow and cause blood clots to form.  Increase your risk for heart attack and stroke. What can increase my risk for high cholesterol? This condition is more likely to develop in people who:  Eat foods that are high in saturated fat or cholesterol. Saturated fat is mostly found in foods that come from animal sources.  Are overweight.  Are not getting enough exercise.  Have a family history of high cholesterol (familial hypercholesterolemia). What actions can I take to prevent this? Nutrition  Eat less saturated fat.  Avoid trans fats (partially hydrogenated oils). These are often found in margarine and in some baked goods, fried foods, and snacks bought in packages.  Avoid precooked or cured meat, such as bacon, sausages, or meat loaves.  Avoid foods and drinks that have added sugars.  Eat more fruits, vegetables, and whole grains.  Choose healthy sources of protein, such as fish, poultry, lean cuts of red meat, beans, peas, lentils, and nuts.  Choose healthy sources of fat, such as: ? Nuts. ? Vegetable oils, especially olive oil. ? Fish that have healthy fats,  such as omega-3 fatty acids. These fish include mackerel or salmon.   Lifestyle  Lose weight if you are overweight. Maintaining a healthy body mass index (BMI) can help prevent or control high cholesterol. It can also lower your risk for diabetes and high blood pressure. Ask your health care provider to help you with a diet and exercise plan to lose weight safely.  Do not use any products that contain nicotine or tobacco, such as cigarettes, e-cigarettes, and chewing tobacco. If you need help quitting, ask your health care provider. Alcohol use  Do not drink alcohol if: ? Your health care provider tells you not to drink. ? You are pregnant, may be pregnant, or are planning to become pregnant.  If you drink alcohol: ? Limit how much you use to:  0-1 drink a day for women.  0-2 drinks a day for men. ? Be aware of how much alcohol is in your drink. In the [place] one drink equals one 12 oz bottle of beer (355 mL), one 5 oz glass of wine (148 mL), or one 1 oz glass of hard liquor (44 mL). Activity  Get enough exercise. Do exercises as told by your health care provider.  Each week, do at least 150 minutes of exercise that takes a medium level of effort (moderate-intensity exercise). This kind of exercise: ? Makes your heart beat faster while allowing you to still be able to talk. ? Can be done in short sessions several times a day or longer sessions a few times a week. For example, on 5 days each week, you could walk fast or ride   your bike 3 times a day for 10 minutes each time.   Medicines  Your health care provider may recommend medicines to help lower cholesterol. This may be a medicine to lower the amount of cholesterol that your liver makes. You may need medicine if: ? Diet and lifestyle changes have not lowered your cholesterol enough. ? You have high cholesterol and other risk factors for heart disease or stroke.  Take over-the-counter and prescription medicines only as told by your  health care provider. General information  Manage your risk factors for high cholesterol. Talk with your health care provider about all your risk factors and how to lower your risk.  Manage other conditions that you have, such as diabetes or high blood pressure (hypertension).  Have blood tests to check your cholesterol levels at regular points in time as told by your health care provider.  Keep all follow-up visits as told by your health care provider. This is important. Where to find more information  American Heart Association: www.heart.org  National Heart, Lung, and Blood Institute: www.nhlbi.nih.gov Summary  High cholesterol increases your risk for heart disease and stroke. By keeping your cholesterol level low, you can reduce your risk for these conditions.  High cholesterol can often be prevented with diet and lifestyle changes.  Work with your health care provider to manage your risk factors, and have your blood tested regularly. This information is not intended to replace advice given to you by your health care provider. Make sure you discuss any questions you have with your health care provider. Document Revised: 07/19/2019 Document Reviewed: 07/19/2019 Elsevier Patient Education  2021 [employer]  

## 2020-11-23 NOTE — Progress Notes (Signed)
BP 120/74   Pulse 94   Temp 98.3 F (36.8 C) (Oral)   Ht 5' 7.6" (1.717 m)   Wt 168 lb (76.2 kg)   SpO2 98%   BMI 25.85 kg/m    Subjective:    Patient ID: Nicholas Shah, male    DOB: 1951-10-19, 70 y.o.   MRN: [id number]  HPI: Nicholas Shah is a 70 y.o. male  Chief Complaint  Patient presents with  . Hypertension  . Hyperlipidemia   HYPERTENSION / HYPERLIPIDEMIA Started on Lisinopril and Atorvastatin on 10/25/2020.  Noted to have some CKD 3a on recent labs with CRT 1.42 and GFR 50.  Denies any ADR with new medications -- although only taking Lisinopril and not Atorvastatin, reports they did not have this.   Satisfied with current treatment? yes Duration of hypertension: chronic BP monitoring frequency: not checking BP range:  BP medication side effects: no Duration of hyperlipidemia: chronic Aspirin: no Recent stressors: no Recurrent headaches: no Visual changes: no Palpitations: no Dyspnea: no Chest pain: no Lower extremity edema: no Dizzy/lightheaded: no  The 10-year ASCVD risk score Denman George DC Jr., et al., 2013) is: 15.4%   Values used to calculate the score:     Age: 42 years     Sex: Male     Is Non-Hispanic African American: Yes     Diabetic: No     Tobacco smoker: No     Systolic Blood Pressure: 120 mmHg     Is BP treated: Yes     HDL Cholesterol: 71 mg/dL     Total Cholesterol: 257 mg/dL  COUGH Present for over a year, has had full work-up with NP at department. Has had CXR and Covid testing with everything clear.  Was told to take Claritin, which helped for a little while. He stopped taking.  Reports issues with allergies every year.  Non-smoker.  He reports no increase in cough with Lisinopril added on.  Has never had allergy testing.  Symptoms are better in the summer and worse in the winter. Duration: months Circumstances of initial development of cough: allergies Cough severity: mild Cough description: non-productive Aggravating factors:   nothing Alleviating factors: Claritin D Status:  stable Treatments attempted: Claritin D Wheezing: no Shortness of breath: no Chest pain: no Chest tightness:no Nasal congestion: no Runny nose: in morning Postnasal drip: yes Frequent throat clearing or swallowing: yes Hemoptysis: no Fevers: no Night sweats: no Weight loss: no Heartburn: no Recent foreign travel: no Tuberculosis contacts: no   Relevant past medical, surgical, family and social history reviewed and updated as indicated. Interim medical history since our last visit reviewed. Allergies and medications reviewed and updated.  Review of Systems  Constitutional: Negative for activity change, diaphoresis, fatigue and fever.  Respiratory: Negative for cough, chest tightness, shortness of breath and wheezing.   Cardiovascular: Negative for chest pain, palpitations and leg swelling.  Gastrointestinal: Negative.   Musculoskeletal: Negative.   Skin: Negative.   Neurological: Negative.   Psychiatric/Behavioral: Negative.     Per HPI unless specifically indicated above     Objective:    BP 120/74   Pulse 94   Temp 98.3 F (36.8 C) (Oral)   Ht 5' 7.6" (1.717 m)   Wt 168 lb (76.2 kg)   SpO2 98%   BMI 25.85 kg/m   Wt Readings from Last 3 Encounters:  11/23/20 168 lb (76.2 kg)  10/25/20 164 lb 3.2 oz (74.5 kg)  07/19/20 165 lb (74.8 kg)  Physical Exam Vitals and nursing note reviewed.  Constitutional:      General: He is awake. He is not in acute distress.    Appearance: He is well-developed and well-groomed. He is not ill-appearing or toxic-appearing.  HENT:     Head: Normocephalic and atraumatic.     Right Ear: Hearing, tympanic membrane, ear canal and external ear normal. No drainage.     Left Ear: Hearing, tympanic membrane, ear canal and external ear normal. No drainage.     Ears:     Comments: Sniffling and clearing throat often.    Nose: Nose normal.     Mouth/Throat:     Pharynx: Oropharynx is  clear. Uvula midline.  Eyes:     General: Lids are normal.        Right eye: No discharge.        Left eye: No discharge.     Conjunctiva/sclera: Conjunctivae normal.     Pupils: Pupils are equal, round, and reactive to light.  Neck:     Thyroid: No thyromegaly.     Vascular: No carotid bruit or JVD.     Trachea: Trachea normal.  Cardiovascular:     Rate and Rhythm: Normal rate and regular rhythm.     Heart sounds: Normal heart sounds, S1 normal and S2 normal. No murmur heard. No gallop.   Pulmonary:     Effort: Pulmonary effort is normal.     Breath sounds: Normal breath sounds.  Abdominal:     General: Bowel sounds are normal.     Palpations: Abdomen is soft.  Musculoskeletal:        General: Normal range of motion.     Cervical back: Normal range of motion and neck supple.     Right lower leg: No edema.     Left lower leg: No edema.  Skin:    General: Skin is warm and dry.     Capillary Refill: Capillary refill takes less than 2 seconds.  Neurological:     Mental Status: He is alert and oriented to person, place, and time.  Psychiatric:        Attention and Perception: Attention normal.        Mood and Affect: Mood normal.        Speech: Speech normal.        Behavior: Behavior normal. Behavior is cooperative.        Thought Content: Thought content normal.    Results for orders placed or performed in visit on 11/02/20  Basic Metabolic Panel (BMET)  Result Value Ref Range   Glucose 91 65 - 99 mg/dL   BUN 20 8 - 27 mg/dL   Creatinine, Ser 3.72 (H) 0.76 - 1.27 mg/dL   GFR calc non Af Amer 50 (L) >59 mL/min/1.73   GFR calc Af Amer 58 (L) >59 mL/min/1.73   BUN/Creatinine Ratio 14 10 - 24   Sodium 144 134 - 144 mmol/L   Potassium 4.8 3.5 - 5.2 mmol/L   Chloride 105 96 - 106 mmol/L   CO2 25 20 - 29 mmol/L   Calcium 9.6 8.6 - 10.2 mg/dL      Assessment & Plan:   Problem List Items Addressed This Visit      Cardiovascular and Mediastinum   Essential hypertension     Ongoing with much improvement with addition of Lisinopril.  Continue this dosing at this time and adjust as needed, monitor closely as has chronic underlying cough and may benefit from change  to Losartan in future if increased cough presents.  BMP today.  Recommend she monitor BP at least a few mornings a week at home and document.  DASH diet at home.  Return in 8 weeks.       Relevant Medications   atorvastatin (LIPITOR) 10 MG tablet   Other Relevant Orders   Basic metabolic panel     Respiratory   Allergic rhinitis    Chronic, ongoing with nagging cough -- suspect more related to allergic rhinitis.  Recommend using plain Claritin or Allegra without the D on a daily basis + Flonase -- scripts sent and will trial this, recommend he use consistently and daily.  Appears recent cough is related to allergic rhinitis since had benefit from antihistamine in past.  Adjust regimen as needed.  Return in 8 weeks.        Genitourinary   Stage 3a chronic kidney disease (HCC) - Primary    Noted on recent labs, recheck today.  Continue Lisinopril for kidney function.  Recommend increase water intake at home and decrease Dr. Reino Kent.  BMP today.        Other   Hyperlipidemia    Chronic, ongoing, did not start Atorvastatin yet.  Has significant family history with father and grandfather having strokes.  Highly recommend he obtain Atorvastatin script and start taking daily.  Script resent.  Goal LDL <70 for this patient.  Return in 8 weeks.      Relevant Medications   atorvastatin (LIPITOR) 10 MG tablet       Follow up plan: Return in about 8 weeks (around 01/18/2021) for HLD and COUGH.

## 2020-11-23 NOTE — Assessment & Plan Note (Signed)
Chronic, ongoing, did not start Atorvastatin yet.  Has significant family history with father and grandfather having strokes.  Highly recommend he obtain Atorvastatin script and start taking daily.  Script resent.  Goal LDL <70 for this patient.  Return in 8 weeks.

## 2020-11-23 NOTE — Telephone Encounter (Signed)
Called pt to go over message from [place] no answer left vm

## 2020-11-23 NOTE — Telephone Encounter (Signed)
-----   Message from Sherryl Barters sent at 11/23/2020  2:20 PM EST ----- Patient has a balance because his insurance says he has a [employer]. If this is not true the patient will need to contact [employer] to let them know about their error.  Thanks ----- Message ----- From: Harriet Pho Sent: 11/23/2020   9:13 AM EST To: Claris Che can you look into this patient's balance please. Just want to make sure pt has [place] and I know you sent [place] the email about what happened with [place]. Just wanted you to be aware.

## 2020-11-23 NOTE — Assessment & Plan Note (Addendum)
Chronic, ongoing with nagging cough -- suspect more related to allergic rhinitis.  Recommend using plain Claritin or Allegra without the D on a daily basis + Flonase -- scripts sent and will trial this, recommend he use consistently and daily.  Appears recent cough is related to allergic rhinitis since had benefit from antihistamine in past.  Adjust regimen as needed.  Return in 8 weeks.

## 2020-11-23 NOTE — Assessment & Plan Note (Signed)
Noted on recent labs, recheck today.  Continue Lisinopril for kidney function.  Recommend increase water intake at home and decrease Dr. Reino Kent.  BMP today.

## 2020-11-24 LAB — BASIC METABOLIC PANEL
BUN/Creatinine Ratio: 14 (ref 10–24)
BUN: 21 mg/dL (ref 8–27)
CO2: 24 mmol/L (ref 20–29)
Calcium: 9.4 mg/dL (ref 8.6–10.2)
Chloride: 105 mmol/L (ref 96–106)
Creatinine, Ser: 1.48 mg/dL — ABNORMAL HIGH (ref 0.76–1.27)
GFR calc Af Amer: 55 mL/min/{1.73_m2} — ABNORMAL LOW (ref >59)
GFR calc non Af Amer: 48 mL/min/{1.73_m2} — ABNORMAL LOW (ref >59)
Glucose: 83 mg/dL (ref 65–99)
Potassium: 4.2 mmol/L (ref 3.5–5.2)
Sodium: 143 mmol/L (ref 134–144)

## 2020-11-25 NOTE — Progress Notes (Signed)
Contacted via MyChart   Good evening Nicholas Shah, your labs have returned.  Kidney function continues to show some mild kidney disease, we will continue to monitor closely.  No decline at this time.  We will recheck your cholesterol levels next visit, one you are taking medication daily.  Any questions? Keep being awesome!!  Thank you for allowing me to participate in your care. Kindest regards, Churchill Grimsley

## 2021-01-09 ENCOUNTER — Encounter: Payer: Self-pay | Admitting: Nurse Practitioner

## 2021-01-16 ENCOUNTER — Encounter: Payer: Self-pay | Admitting: Nurse Practitioner

## 2021-01-16 ENCOUNTER — Ambulatory Visit (INDEPENDENT_AMBULATORY_CARE_PROVIDER_SITE_OTHER): Payer: Managed Care, Other (non HMO) | Admitting: Nurse Practitioner

## 2021-01-16 ENCOUNTER — Other Ambulatory Visit: Payer: Self-pay

## 2021-01-16 VITALS — BP 132/82 | HR 73 | Temp 98.6°F | Wt 161.8 lb

## 2021-01-16 DIAGNOSIS — N1831 Chronic kidney disease, stage 3a: Secondary | ICD-10-CM | POA: Diagnosis not present

## 2021-01-16 DIAGNOSIS — R053 Chronic cough: Secondary | ICD-10-CM | POA: Insufficient documentation

## 2021-01-16 DIAGNOSIS — I10 Essential (primary) hypertension: Secondary | ICD-10-CM | POA: Diagnosis not present

## 2021-01-16 DIAGNOSIS — E782 Mixed hyperlipidemia: Secondary | ICD-10-CM

## 2021-01-16 DIAGNOSIS — J301 Allergic rhinitis due to pollen: Secondary | ICD-10-CM

## 2021-01-16 MED ORDER — LOSARTAN POTASSIUM 25 MG PO TABS: 25.0000 mg | ORAL_TABLET | Freq: Every day | ORAL | 4 refills | Status: DC

## 2021-01-16 MED ORDER — FAMOTIDINE 20 MG PO TABS: 20.0000 mg | ORAL_TABLET | Freq: Every day | ORAL | 2 refills | Status: DC

## 2021-01-16 MED ORDER — FLUTICASONE PROPIONATE 50 MCG/ACT NA SUSP
2.0000 | Freq: Every day | NASAL | 6 refills | Status: DC
Start: 2021-01-16 — End: 2021-02-18

## 2021-01-16 NOTE — Assessment & Plan Note (Signed)
Chronic issue for over one year with CXR normal in May 2021.  Spirometry today with normal levels.  Has not had much benefit from Claritin and Flonase daily.  Concern as he does endorse reduced appetite and has had 7 pound loss since visit one month ago.  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  Obtain labs today to include CBC, CMP, TSH, Quantiferon, and TSH.  Recommend trial of Pepcid 20 MG daily to assess if related to reflux issues, denies symptoms of heart burn.  Would benefit from colonoscopy in future.  Plan for return in 3 weeks to review imaging and labs + cough symptoms.  If ongoing consider referral to pulmonology.

## 2021-01-16 NOTE — Assessment & Plan Note (Signed)
Ongoing with much improvement with addition of Lisinopril.  At this time due to his underlying chronic cough for over one year, will change to Losartan 25 MG daily, discussed with patient -- this will continue to offer kidney protection with his CKD. He agrees with this plan.  CMP, CBC, TSH today.  Recommend he monitor BP at least a few mornings a week at home and document.  DASH diet at home.  Return in 8 weeks.

## 2021-01-16 NOTE — Assessment & Plan Note (Signed)
Chronic, ongoing.  Has significant family history with father and grandfather having strokes.  Continue daily statin at this time and adjust as needed.  Lipid panel today. Goal LDL <70 for this patient.  Return in 6 months for this. 

## 2021-01-16 NOTE — Assessment & Plan Note (Signed)
Ongoing, recheck today.  Continue ARB for kidney function.  Recommend increase water intake at home and decrease Dr. Reino Kent.  CMP today.

## 2021-01-16 NOTE — Patient Instructions (Signed)

## 2021-01-16 NOTE — Progress Notes (Signed)
BP 132/82   Pulse 73   Temp 98.6 F (37 C) (Oral)   Wt 161 lb 12.8 oz (73.4 kg)   SpO2 98%   BMI 24.90 kg/m    Subjective:    Patient ID: Nicholas Shah, male    DOB: [birth year], 70 y.o.   MRN: [id number]  HPI: Nicholas JarvisSherman C Shah is a 70 y.o. male  Chief Complaint  Patient presents with  . Hyperlipidemia  . Cough    Patient states Nicholas Shah has been dealing with this cough for the past year and no one would not see him due to thinking Nicholas Shah had covid.   HYPERTENSION / HYPERLIPIDEMIA Started on Lisinopril 10/25/2020.  Noted to have some CKD 3a on recent labs with CRT 1.48 and GFR 55.  Last visit was told to start Atorvastatin, as had not started. Satisfied with current treatment? yes Duration of hypertension: chronic BP monitoring frequency: not checking BP range:  BP medication side effects: no Duration of hyperlipidemia: chronic Aspirin: no Recent stressors: no Recurrent headaches: no Visual changes: no Palpitations: no Dyspnea: no Chest pain: no Lower extremity edema: no Dizzy/lightheaded: no  The 10-year ASCVD risk score Denman George(Goff DC Jr., et al., 2013) is: 18.2%   Values used to calculate the score:     Age: [age] years     Sex: Male     Is Non-Hispanic African American: Yes     Diabetic: No     Tobacco smoker: No     Systolic Blood Pressure: 132 mmHg     Is BP treated: Yes     HDL Cholesterol: 71 mg/dL     Total Cholesterol: 257 mg/dL  COUGH Present for over a year, has had full work-up with NP at health department. Has had CXR and Covid testing with everything clear (chest x-ray May 2021 was normal).  Started on Claritin and Flonase last visit with minimal benefit. Reports issues with allergies every year.  Non-smoker, wife is non smoker. Nicholas Shah reports no increase in cough with Lisinopril added on, but no better.  Has never had allergy testing. Symptoms are better in the summer and worse in the winter.  Cough appears to be worse in evenings, when gets up in morning not as bad  but has drainage.  Growing up his dad did smoke all his life.  Worked in a dye house most of life, particles and other things in air.  Did have Covid 09/26/20 -- asymptomatic at time, cough was present prior to this. Duration: months Circumstances of initial development of cough: allergies Cough severity: mild Cough description: non-productive Aggravating factors:  nothing Alleviating factors: nothing Status:  stable Treatments attempted: Claritin and Flonase Wheezing: no Shortness of breath: no Chest pain: no Chest tightness:no Nasal congestion: no Runny nose: in morning -- improved with medication Postnasal drip: yes -- improving with medication Frequent throat clearing or swallowing: yes Hemoptysis: no Fevers: no Night sweats: no Weight loss: no -- has lost 7 pounds since last visit, decreased appetite over past year Heartburn: no -- does not cough more after meals Recent foreign travel: no Tuberculosis contacts: no   Relevant past medical, surgical, family and social history reviewed and updated as indicated. Interim medical history since our last visit reviewed. Allergies and medications reviewed and updated.  Review of Systems  Constitutional: Negative for activity change, diaphoresis, fatigue and fever.  Respiratory: Negative for cough, chest tightness, shortness of breath and wheezing.   Cardiovascular: Negative for chest pain, palpitations and leg swelling.  Gastrointestinal: Negative.   Musculoskeletal: Negative.   Skin: Negative.   Neurological: Negative.   Psychiatric/Behavioral: Negative.     Per HPI unless specifically indicated above     Objective:    BP 132/82   Pulse 73   Temp 98.6 F (37 C) (Oral)   Wt 161 lb 12.8 oz (73.4 kg)   SpO2 98%   BMI 24.90 kg/m   Wt Readings from Last 3 Encounters:  01/16/21 161 lb 12.8 oz (73.4 kg)  11/23/20 168 lb (76.2 kg)  10/25/20 164 lb 3.2 oz (74.5 kg)    Physical Exam Vitals and nursing note reviewed.   Constitutional:      General: Nicholas Shah is awake. Nicholas Shah is not in acute distress.    Appearance: Nicholas Shah is well-developed and well-groomed. Nicholas Shah is not ill-appearing or toxic-appearing.  HENT:     Head: Normocephalic and atraumatic.     Right Ear: Hearing, tympanic membrane, ear canal and external ear normal. No drainage.     Left Ear: Hearing, tympanic membrane, ear canal and external ear normal. No drainage.     Ears:     Comments: Sniffling and clearing throat often.    Nose: Rhinorrhea present. Rhinorrhea is clear.     Right Sinus: No maxillary sinus tenderness or frontal sinus tenderness.     Left Sinus: No maxillary sinus tenderness or frontal sinus tenderness.     Mouth/Throat:     Mouth: Mucous membranes are moist.     Pharynx: Oropharynx is clear. Uvula midline. Posterior oropharyngeal erythema (cobblestoning noted) present. No pharyngeal swelling or oropharyngeal exudate.  Eyes:     General: Lids are normal.        Right eye: No discharge.        Left eye: No discharge.     Conjunctiva/sclera: Conjunctivae normal.     Pupils: Pupils are equal, round, and reactive to light.  Neck:     Thyroid: No thyromegaly.     Vascular: No carotid bruit or JVD.     Trachea: Trachea normal.  Cardiovascular:     Rate and Rhythm: Normal rate and regular rhythm.     Heart sounds: Normal heart sounds, S1 normal and S2 normal. No murmur heard. No gallop.   Pulmonary:     Effort: Pulmonary effort is normal. No accessory muscle usage or respiratory distress.     Breath sounds: Normal breath sounds.     Comments: Lung sounds clear throughout without wheezing or rhonchi. Abdominal:     General: Bowel sounds are normal.     Palpations: Abdomen is soft.  Musculoskeletal:        General: Normal range of motion.     Cervical back: Normal range of motion and neck supple.     Right lower leg: No edema.     Left lower leg: No edema.  Skin:    General: Skin is warm and dry.     Capillary Refill: Capillary  refill takes less than 2 seconds.  Neurological:     Mental Status: Nicholas Shah is alert and oriented to person, place, and time.  Psychiatric:        Attention and Perception: Attention normal.        Mood and Affect: Mood normal.        Speech: Speech normal.        Behavior: Behavior normal. Behavior is cooperative.        Thought Content: Thought content normal.    Results for orders placed or  performed in visit on 11/23/20  Basic metabolic panel  Result Value Ref Range   Glucose 83 65 - 99 mg/dL   BUN 21 8 - 27 mg/dL   Creatinine, Ser 2.87 (H) 0.76 - 1.27 mg/dL   GFR calc non Af Amer 48 (L) >59 mL/min/1.73   GFR calc Af Amer 55 (L) >59 mL/min/1.73   BUN/Creatinine Ratio 14 10 - 24   Sodium 143 134 - 144 mmol/L   Potassium 4.2 3.5 - 5.2 mmol/L   Chloride 105 96 - 106 mmol/L   CO2 24 20 - 29 mmol/L   Calcium 9.4 8.6 - 10.2 mg/dL      Assessment & Plan:   Problem List Items Addressed This Visit      Cardiovascular and Mediastinum   Essential hypertension    Ongoing with much improvement with addition of Lisinopril.  At this time due to his underlying chronic cough for over one year, will change to Losartan 25 MG daily, discussed with patient -- this will continue to offer kidney protection with his CKD. Nicholas Shah agrees with this plan.  CMP, CBC, TSH today.  Recommend Nicholas Shah monitor BP at least a few mornings a week at home and document.  DASH diet at home.  Return in 8 weeks.       Relevant Medications   losartan (COZAAR) 25 MG tablet     Respiratory   Allergic rhinitis    Chronic, ongoing with nagging cough -- suspect more related to allergic rhinitis; however had not benefited from daily Claritin and Flonase + endorses reduced appetite and noted to have 7 pound weight loss since recent visit one month ago.  Recommend continue plain Claritin without the D on a daily basis + Flonase -- refills sent, recommend Nicholas Shah use consistently and daily.   Adjust regimen as needed.  Return in 3 weeks.         Genitourinary   Stage 3a chronic kidney disease (HCC) - Primary    Ongoing, recheck today.  Continue ARB for kidney function.  Recommend increase water intake at home and decrease Dr. Reino Kent.  CMP today.        Other   Hyperlipidemia    Chronic, ongoing.  Has significant family history with father and grandfather having strokes.  Continue daily statin at this time and adjust as needed.  Lipid panel today. Goal LDL <70 for this patient.  Return in 6 months for this.      Relevant Medications   losartan (COZAAR) 25 MG tablet   Other Relevant Orders   Lipid Panel w/o Chol/HDL Ratio   Chronic cough    Chronic issue for over one year with CXR normal in May 2021.  Spirometry today with normal levels.  Has not had much benefit from Claritin and Flonase daily.  Concern as Nicholas Shah does endorse reduced appetite and has had 7 pound loss since visit one month ago.  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  Obtain labs today to include CBC, CMP, TSH, Quantiferon, and TSH.  Recommend trial of Pepcid 20 MG daily to assess if related to reflux issues, denies symptoms of heart burn.  Would benefit from colonoscopy in future.  Plan for return in 3 weeks to review imaging and labs + cough symptoms.  If ongoing consider referral to pulmonology.        Relevant Orders  Spirometry with graph (Completed)   CBC with Differential/Platelet   Comprehensive metabolic panel   TSH   QuantiFERON-TB Gold Plus   CT Chest Wo Contrast       Follow up plan: Return in about 3 weeks (around 02/06/2021) for Chronic cough -- imaging follow-up.

## 2021-01-16 NOTE — Assessment & Plan Note (Signed)
Chronic, ongoing with nagging cough -- suspect more related to allergic rhinitis; however had not benefited from daily Claritin and Flonase + endorses reduced appetite and noted to have 7 pound weight loss since recent visit one month ago.  Recommend continue plain Claritin without the D on a daily basis + Flonase -- refills sent, recommend he use consistently and daily.   Adjust regimen as needed.  Return in 3 weeks.

## 2021-01-17 NOTE — Progress Notes (Signed)
Contacted via MyChart The 10-year ASCVD risk score Mikey Bussing DC Jr., et al., 2013) is: 17%   Values used to calculate the score:     Age: 70 years     Sex: Male     Is Non-Hispanic African American: Yes     Diabetic: No     Tobacco smoker: No     Systolic Blood Pressure: 409 mmHg     Is BP treated: Yes     HDL Cholesterol: 72 mg/dL     Total Cholesterol: 177 mg/dL   Good evening Nicholas Shah, your labs have returned: - CBC shows no anemia. - Kidney function continues to show some mild kidney disease with creatinine 1.35 and eGFR 57.  Calcium mildly elevated, next visit we will add on to check parathyroid which at times is effected with kidney disease. - Thyroid labs normal - Cholesterol labs are showing some improvement -- LDL has decreased from 154 to 88 and total cholesterol has decreased from 257 to 177 -- much improved with Atorvastatin.  Continue this daily.   - TB testing has not returned yet, will let you know when it does return. Keep being awesome!!  Thank you for allowing me to participate in your care. Kindest regards, Nicholas Shah

## 2021-01-18 ENCOUNTER — Ambulatory Visit: Payer: Managed Care, Other (non HMO) | Admitting: Nurse Practitioner

## 2021-01-21 LAB — COMPREHENSIVE METABOLIC PANEL
ALT: 19 IU/L (ref 0–44)
AST: 21 IU/L (ref 0–40)
Albumin/Globulin Ratio: 2.3 — ABNORMAL HIGH (ref 1.2–2.2)
Albumin: 5 g/dL — ABNORMAL HIGH (ref 3.8–4.8)
Alkaline Phosphatase: 73 IU/L (ref 44–121)
BUN/Creatinine Ratio: 13 (ref 10–24)
BUN: 17 mg/dL (ref 8–27)
Bilirubin Total: 0.6 mg/dL (ref 0.0–1.2)
CO2: 22 mmol/L (ref 20–29)
Calcium: 10.3 mg/dL — ABNORMAL HIGH (ref 8.6–10.2)
Chloride: 103 mmol/L (ref 96–106)
Creatinine, Ser: 1.35 mg/dL — ABNORMAL HIGH (ref 0.76–1.27)
Globulin, Total: 2.2 g/dL (ref 1.5–4.5)
Glucose: 87 mg/dL (ref 65–99)
Potassium: 4.8 mmol/L (ref 3.5–5.2)
Sodium: 143 mmol/L (ref 134–144)
Total Protein: 7.2 g/dL (ref 6.0–8.5)
eGFR: 57 mL/min/{1.73_m2} — ABNORMAL LOW (ref >59)

## 2021-01-21 LAB — CBC WITH DIFFERENTIAL/PLATELET
Basophils Absolute: 0 10*3/uL (ref 0.0–0.2)
Basos: 1 %
EOS (ABSOLUTE): 0.2 10*3/uL (ref 0.0–0.4)
Eos: 4 %
Hematocrit: 45.7 % (ref 37.5–51.0)
Hemoglobin: 15.5 g/dL (ref 13.0–17.7)
Immature Grans (Abs): 0 10*3/uL (ref 0.0–0.1)
Immature Granulocytes: 0 %
Lymphocytes Absolute: 1.8 10*3/uL (ref 0.7–3.1)
Lymphs: 41 %
MCH: 30.3 pg (ref 26.6–33.0)
MCHC: 33.9 g/dL (ref 31.5–35.7)
MCV: 89 fL (ref 79–97)
Monocytes Absolute: 0.5 10*3/uL (ref 0.1–0.9)
Monocytes: 10 %
Neutrophils Absolute: 2 10*3/uL (ref 1.4–7.0)
Neutrophils: 44 %
Platelets: 241 10*3/uL (ref 150–450)
RBC: 5.11 x10E6/uL (ref 4.14–5.80)
RDW: 12.5 % (ref 11.6–15.4)
WBC: 4.5 10*3/uL (ref 3.4–10.8)

## 2021-01-21 LAB — LIPID PANEL W/O CHOL/HDL RATIO
Cholesterol, Total: 177 mg/dL (ref 100–199)
HDL: 72 mg/dL (ref >39)
LDL Chol Calc (NIH): 88 mg/dL (ref 0–99)
Triglycerides: 96 mg/dL (ref 0–149)
VLDL Cholesterol Cal: 17 mg/dL (ref 5–40)

## 2021-01-21 LAB — QUANTIFERON-TB GOLD PLUS
QuantiFERON Mitogen Value: >10 IU/mL
QuantiFERON Nil Value: 0.03 IU/mL
QuantiFERON TB1 Ag Value: 0.19 IU/mL
QuantiFERON TB2 Ag Value: 0.02 IU/mL
QuantiFERON-TB Gold Plus: NEGATIVE

## 2021-01-21 LAB — TSH: TSH: 0.896 u[IU]/mL (ref 0.450–4.500)

## 2021-01-24 ENCOUNTER — Telehealth: Payer: Self-pay

## 2021-01-24 NOTE — Telephone Encounter (Signed)
PA for CT of chest has been deined by insurance.

## 2021-01-24 NOTE — Telephone Encounter (Signed)
Copied from CRM [id number]. Topic: General - Other >> Jan 24, 2021  3:10 PM Pawlus, Maxine Glenn A wrote: Reason for CRM: Caller from [facility] was following up on a fax that was sent over for some orders that have not been faxed back yet. FAX [phone]

## 2021-01-25 NOTE — Telephone Encounter (Signed)
Thank you! The patient is now scheduled on 02/11/21 for CT.

## 2021-01-25 NOTE — Telephone Encounter (Signed)
Our office received a fax yesterday denying patient of CT of Chest imaging. I returned a missed call this morning from a call we received from [facility] yesterday afternoon. Spoke with a [employer] representative this morning and was informed that patient was approved for CT imaging procedure code 93810. Patient is approved from effective dates of 01/24/21 through 04/24/21. Authorization approval number is [id number]. Please advise.

## 2021-02-06 ENCOUNTER — Ambulatory Visit: Payer: Managed Care, Other (non HMO) | Admitting: Nurse Practitioner

## 2021-02-11 ENCOUNTER — Other Ambulatory Visit: Payer: Self-pay

## 2021-02-11 ENCOUNTER — Ambulatory Visit
Admission: RE | Admit: 2021-02-11 | Discharge: 2021-02-11 | Disposition: A | Discharge status: Home / Self Care | Payer: Managed Care, Other (non HMO) | Source: Ambulatory Visit | Attending: Nurse Practitioner | Admitting: Nurse Practitioner

## 2021-02-11 DIAGNOSIS — R053 Chronic cough: Secondary | ICD-10-CM

## 2021-02-12 ENCOUNTER — Encounter: Payer: Self-pay | Admitting: Nurse Practitioner

## 2021-02-12 DIAGNOSIS — I700 Atherosclerosis of aorta: Secondary | ICD-10-CM | POA: Insufficient documentation

## 2021-02-12 NOTE — Progress Notes (Signed)
Contacted via MyChart   Good evening Mr. Bostock, your lung CT has returned and no abnormalities noted with lungs, this is good news.  How are you feeling?  How is the cough?  Any questions? Keep being awesome!!  Thank you for allowing me to participate in your care. Kindest regards, Sophiamarie Nease

## 2021-02-18 ENCOUNTER — Encounter: Payer: Self-pay | Admitting: Nurse Practitioner

## 2021-02-18 ENCOUNTER — Ambulatory Visit (INDEPENDENT_AMBULATORY_CARE_PROVIDER_SITE_OTHER): Payer: Managed Care, Other (non HMO) | Admitting: Nurse Practitioner

## 2021-02-18 ENCOUNTER — Other Ambulatory Visit: Payer: Self-pay

## 2021-02-18 DIAGNOSIS — J301 Allergic rhinitis due to pollen: Secondary | ICD-10-CM

## 2021-02-18 DIAGNOSIS — R053 Chronic cough: Secondary | ICD-10-CM

## 2021-02-18 DIAGNOSIS — I10 Essential (primary) hypertension: Secondary | ICD-10-CM | POA: Diagnosis not present

## 2021-02-18 DIAGNOSIS — I700 Atherosclerosis of aorta: Secondary | ICD-10-CM | POA: Diagnosis not present

## 2021-02-18 MED ORDER — FLUTICASONE PROPIONATE 50 MCG/ACT NA SUSP
2.0000 | Freq: Every day | NASAL | 6 refills | Status: DC
Start: 2021-02-18 — End: 2022-02-20

## 2021-02-18 MED ORDER — LORATADINE 10 MG PO TABS
10.0000 mg | ORAL_TABLET | Freq: Every day | ORAL | 4 refills | Status: DC
Start: 2021-02-18 — End: 2022-02-20

## 2021-02-18 NOTE — Assessment & Plan Note (Addendum)
Chronic issue for over one year with normal CT lung imaging recently and improvement in symptoms at this time.  No red flag symptoms.  Has had benefit from Claritin and Flonase daily, will continue this regimen as suspect more allergic rhinitis related cough.  Recommend he also trial Pepcid which was sent in last visit.  Would benefit from colonoscopy in future, but refuses.  Plan for return in 6 months.  If ongoing consider referral to pulmonology.

## 2021-02-18 NOTE — Assessment & Plan Note (Signed)
Ongoing with much improvement with addition of Losartan.  Continue this medication regimen at this time.  Recommend he monitor BP at least a few mornings a week at home and document.  DASH diet at home.  Return in 6 months.

## 2021-02-18 NOTE — Progress Notes (Signed)
BP 126/72 (BP Location: Left Arm)   Pulse 79   Temp 98.2 F (36.8 C) (Oral)   Wt 164 lb 6.4 oz (74.6 kg)   SpO2 95%   BMI 25.30 kg/m    Subjective:    Patient ID: Nicholas Shah, male    DOB: 06/11/1951, 70 y.o.   MRN: [id number]  HPI: Nicholas Shah is a 70 y.o. male  Chief Complaint  Patient presents with  . Cough    Patient is here for follow up on chronic cough. Patient states he is got a little better and states he still has some coughing but not as bad.    COUGH Present for over a year, has had [facility] work[facility]. Has had CXR and Covid testing with everything clear (chest x-ray May 2021 was normal).  Started on Claritin and Flonase in February with minimal benefit. Reports issues with allergies every year.  Non-smoker, wife is non smoker. He reports no increase in cough with Lisinopril added on, but no better -- was changed to Losartan last visit due to history of long term cough prior to medication.  Has never had allergy testing. Symptoms are better in the summer and worse in the winter.  Cough appears to be worse in evenings, when gets up in morning, but has nasal drainage.  Growing up his dad did smoke all his life.  Worked in a dye house most of life, particles and other things in air.  Did have Covid 09/26/20 -- asymptomatic at time, cough was present prior to this.  Chest CT performed on 02/11/21 noted no acute findings and lungs were clear -- did note aortic atherosclerosis.  At last visit (01/16/21) we sent in trial of Pepcid -- which he never started, at this time reports cough has gotten a little better and not as bad as previously. Duration: months Circumstances of initial development of cough: allergies Cough severity: mild Cough description: non-productive Aggravating factors:  nothing Alleviating factors: nothing Status:  stable Treatments attempted: Claritin and Flonase, Pepcid Wheezing: no Shortness of breath: no Chest pain:  no Chest tightness:no Nasal congestion: no Runny nose: in morning -- improved with medication Postnasal drip: yes -- improving with medication Frequent throat clearing or swallowing: yes Hemoptysis: no Fevers: no Night sweats: no Weight loss: no -- has lost 7 pounds since last visit, decreased appetite over past year Heartburn: no -- does not cough more after meals Recent foreign travel: no Tuberculosis contacts: no   Relevant past medical, surgical, family and social history reviewed and updated as indicated. Interim medical history since our last visit reviewed. Allergies and medications reviewed and updated.  Review of Systems  Constitutional: Negative for activity change, diaphoresis, fatigue and fever.  Respiratory: Negative for cough, chest tightness, shortness of breath and wheezing.   Cardiovascular: Negative for chest pain, palpitations and leg swelling.  Gastrointestinal: Negative.   Musculoskeletal: Negative.   Skin: Negative.   Neurological: Negative.   Psychiatric/Behavioral: Negative.     Per HPI unless specifically indicated above     Objective:    BP 126/72 (BP Location: Left Arm)   Pulse 79   Temp 98.2 F (36.8 C) (Oral)   Wt 164 lb 6.4 oz (74.6 kg)   SpO2 95%   BMI 25.30 kg/m   Wt Readings from Last 3 Encounters:  02/18/21 164 lb 6.4 oz (74.6 kg)  01/16/21 161 lb 12.8 oz (73.4 kg)  11/23/20 168 lb (76.2 kg)  Physical Exam Vitals and nursing note reviewed.  Constitutional:      General: He is awake. He is not in acute distress.    Appearance: He is well-developed and well-groomed. He is not ill-appearing or toxic-appearing.  HENT:     Head: Normocephalic and atraumatic.     Right Ear: Hearing, tympanic membrane, ear canal and external ear normal. No drainage.     Left Ear: Hearing, tympanic membrane, ear canal and external ear normal. No drainage.  Eyes:     General: Lids are normal.        Right eye: No discharge.        Left eye: No  discharge.     Conjunctiva/sclera: Conjunctivae normal.     Pupils: Pupils are equal, round, and reactive to light.  Neck:     Thyroid: No thyromegaly.     Vascular: No carotid bruit or JVD.     Trachea: Trachea normal.  Cardiovascular:     Rate and Rhythm: Normal rate and regular rhythm.     Heart sounds: Normal heart sounds, S1 normal and S2 normal. No murmur heard. No gallop.   Pulmonary:     Effort: Pulmonary effort is normal. No accessory muscle usage or respiratory distress.     Breath sounds: Normal breath sounds.     Comments: Lung sounds clear throughout without wheezing or rhonchi. Abdominal:     General: Bowel sounds are normal.     Palpations: Abdomen is soft.  Musculoskeletal:        General: Normal range of motion.     Cervical back: Normal range of motion and neck supple.     Right lower leg: No edema.     Left lower leg: No edema.  Skin:    General: Skin is warm and dry.     Capillary Refill: Capillary refill takes less than 2 seconds.  Neurological:     Mental Status: He is alert and oriented to person, place, and time.  Psychiatric:        Attention and Perception: Attention normal.        Mood and Affect: Mood normal.        Speech: Speech normal.        Behavior: Behavior normal. Behavior is cooperative.        Thought Content: Thought content normal.    Results for orders placed or performed in visit on 01/16/21  CBC with Differential/Platelet  Result Value Ref Range   WBC 4.5 3.4 - 10.8 x10E3/uL   RBC 5.11 4.14 - 5.80 x10E6/uL   Hemoglobin 15.5 13.0 - 17.7 g/dL   Hematocrit 78.9 08.2 - 51.0 %   MCV 89 79 - 97 fL   MCH 30.3 26.6 - 33.0 pg   MCHC 33.9 31.5 - 35.7 g/dL   RDW 76.0 10.0 - 67.5 %   Platelets 241 150 - 450 x10E3/uL   Neutrophils 44 Not Estab. %   Lymphs 41 Not Estab. %   Monocytes 10 Not Estab. %   Eos 4 Not Estab. %   Basos 1 Not Estab. %   Neutrophils Absolute 2.0 1.4 - 7.0 x10E3/uL   Lymphocytes Absolute 1.8 0.7 - 3.1 x10E3/uL    Monocytes Absolute 0.5 0.1 - 0.9 x10E3/uL   EOS (ABSOLUTE) 0.2 0.0 - 0.4 x10E3/uL   Basophils Absolute 0.0 0.0 - 0.2 x10E3/uL   Immature Granulocytes 0 Not Estab. %   Immature Grans (Abs) 0.0 0.0 - 0.1 x10E3/uL  Comprehensive metabolic panel  Result Value Ref Range   Glucose 87 65 - 99 mg/dL   BUN 17 8 - 27 mg/dL   Creatinine, Ser 1.35 (H) 0.76 - 1.27 mg/dL   eGFR 57 (L) >59 mL/min/1.73   BUN/Creatinine Ratio 13 10 - 24   Sodium 143 134 - 144 mmol/L   Potassium 4.8 3.5 - 5.2 mmol/L   Chloride 103 96 - 106 mmol/L   CO2 22 20 - 29 mmol/L   Calcium 10.3 (H) 8.6 - 10.2 mg/dL   Total Protein 7.2 6.0 - 8.5 g/dL   Albumin 5.0 (H) 3.8 - 4.8 g/dL   Globulin, Total 2.2 1.5 - 4.5 g/dL   Albumin/Globulin Ratio 2.3 (H) 1.2 - 2.2   Bilirubin Total 0.6 0.0 - 1.2 mg/dL   Alkaline Phosphatase 73 44 - 121 IU/L   AST 21 0 - 40 IU/L   ALT 19 0 - 44 IU/L  Lipid Panel w/o Chol/HDL Ratio  Result Value Ref Range   Cholesterol, Total 177 100 - 199 mg/dL   Triglycerides 96 0 - 149 mg/dL   HDL 72 >39 mg/dL   VLDL Cholesterol Cal 17 5 - 40 mg/dL   LDL Chol Calc (NIH) 88 0 - 99 mg/dL  TSH  Result Value Ref Range   TSH 0.896 0.450 - 4.500 uIU/mL  QuantiFERON-TB Gold Plus  Result Value Ref Range   QuantiFERON Incubation Incubation performed.    QuantiFERON Criteria Comment    QuantiFERON TB1 Ag Value 0.19 IU/mL   QuantiFERON TB2 Ag Value 0.02 IU/mL   QuantiFERON Nil Value 0.03 IU/mL   QuantiFERON Mitogen Value >10.00 IU/mL   QuantiFERON-TB Gold Plus Negative Negative      Assessment & Plan:   Problem List Items Addressed This Visit      Cardiovascular and Mediastinum   Essential hypertension    Ongoing with much improvement with addition of Losartan.  Continue this medication regimen at this time.  Recommend he monitor BP at least a few mornings a week at home and document.  DASH diet at home.  Return in 6 months.       Aortic atherosclerosis ([place])    Noted on CT imaging 02/12/21 --  educated patient on finding.  Recommend continue statin daily for prevention + add on daily Baby ASA 81 MG.          Respiratory   Allergic rhinitis    Chronic, improved -- suspect cough more related to allergic rhinitis.  Recommend continue plain Claritin without the D on a daily basis + Flonase -- refills sent, recommend he use consistently and daily.   Adjust regimen as needed.  Return in 6 months.        Other   Chronic cough    Chronic issue for over one year with normal CT lung imaging recently and improvement in symptoms at this time.  No red flag symptoms.  Has had benefit from Claritin and Flonase daily, will continue this regimen as suspect more allergic rhinitis related cough.  Recommend he also trial Pepcid which was sent in last visit.  Would benefit from colonoscopy in future, but refuses.  Plan for return in 6 months.  If ongoing consider referral to pulmonology.            Follow up plan: Return in about 6 months (around 08/21/2021) for Annual physical.

## 2021-02-18 NOTE — Assessment & Plan Note (Signed)
Noted on CT imaging 02/12/21 -- educated patient on finding.  Recommend continue statin daily for prevention + add on daily Baby ASA 81 MG.

## 2021-02-18 NOTE — Assessment & Plan Note (Signed)
Chronic, improved -- suspect cough more related to allergic rhinitis.  Recommend continue plain Claritin without the D on a daily basis + Flonase -- refills sent, recommend he use consistently and daily.   Adjust regimen as needed.  Return in 6 months.

## 2021-02-18 NOTE — Patient Instructions (Signed)

## 2021-05-06 ENCOUNTER — Other Ambulatory Visit: Payer: Self-pay

## 2021-05-06 ENCOUNTER — Ambulatory Visit: Payer: Managed Care, Other (non HMO) | Admitting: Physician Assistant

## 2021-05-06 VITALS — BP 118/74 | HR 87 | Temp 98.0°F | Resp 16 | Ht 64.0 in | Wt 164.0 lb

## 2021-05-06 DIAGNOSIS — Z008 Encounter for other general examination: Secondary | ICD-10-CM

## 2021-05-06 DIAGNOSIS — Z0000 Encounter for general adult medical examination without abnormal findings: Secondary | ICD-10-CM

## 2021-05-07 LAB — LIPID PANEL
Chol/HDL Ratio: 2.5 ratio (ref 0.0–5.0)
Cholesterol, Total: 156 mg/dL (ref 100–199)
HDL: 62 mg/dL (ref >39)
LDL Chol Calc (NIH): 79 mg/dL (ref 0–99)
Triglycerides: 80 mg/dL (ref 0–149)
VLDL Cholesterol Cal: 15 mg/dL (ref 5–40)

## 2021-05-07 LAB — GLUCOSE, RANDOM: Glucose: 90 mg/dL (ref 65–99)

## 2021-05-07 NOTE — Progress Notes (Signed)
  Subjective:     Patient ID: Nicholas Shah, male   DOB: 14-Jul-1951, 70 y.o.   MRN: [id number]  Pt is a 70 y/o male employed with the [employer].. He has been with the [employer] for several years. He presents today for Biometrics physical and labs. He denies any new health issues. On Mar 16, 2021 he had a blood borne exposure. HIV and exposure testing has been negative up to this point. He is treated for hypertension and chronic kidney disease. He is also treated for elevated cholesterol. No reported changes or issues with medications. He relaxes by playing golf and reading.    Review of Systems  Constitutional: Negative.   HENT: Negative.    Eyes: Negative.   Respiratory: Negative.    Cardiovascular: Negative.   Musculoskeletal:  Positive for arthralgias.  Neurological: Negative.       Objective:   Physical Exam Vitals and nursing note reviewed.  Constitutional:      General: He is not in acute distress.    Appearance: He is well-developed.  HENT:     Head: Normocephalic and atraumatic.     Right Ear: External ear normal.     Left Ear: External ear normal.     Mouth/Throat:     Mouth: Mucous membranes are moist.     Pharynx: Oropharynx is clear.  Eyes:     General: No scleral icterus.       Right eye: No discharge.        Left eye: No discharge.     Conjunctiva/sclera: Conjunctivae normal.  Neck:     Trachea: No tracheal deviation.  Cardiovascular:     Rate and Rhythm: Normal rate.  Pulmonary:     Effort: Pulmonary effort is normal. No respiratory distress.     Breath sounds: No stridor.  Abdominal:     General: Bowel sounds are normal. There is no distension.     Palpations: Abdomen is soft.  Musculoskeletal:        General: No swelling or deformity.     Cervical back: Neck supple.  Skin:    General: Skin is warm and dry.     Findings: No rash.  Neurological:     General: No focal deficit present.     Mental Status: He is alert.     Cranial  Nerves: Cranial nerve deficit: no gross deficits.       Assessment:     Encounter for biometric exam    Plan:     Vital signs are within normal limits today. Continue current mediations. Cholesterol and glucose screenings obtained today.  Please increase exercise and embrace an exercise routine. Continue golf for relaxation. Monitor rest and personal time to prevent burn out. Discussed the need for low fat/low cholesterol diet.

## 2021-05-07 NOTE — Patient Instructions (Signed)
Vital signs are within normal limits today. Continue current mediations. Cholesterol and glucose screenings obtained today.  Please increase exercise and embrace an exercise routine. Continue golf for relaxation. Monitor rest and personal time to prevent burn out. Discussed the need for low fat/low cholesterol diet.

## 2021-07-07 IMAGING — CT CT CHEST W/O CM
2 of 4 series · 15 of 36 positions shown, 18 images · non-contrast
Comparison: Chest x-ray 03/15/2020

CLINICAL DATA: Chronic cough, weight loss

EXAM:
CT CHEST WITHOUT CONTRAST
TECHNIQUE: Multidetector CT imaging of the chest was performed following the
standard protocol without IV contrast.

[Series 2: chest 2.00 · axial · 0.70mm/px · z∈[-1165,-913]mm · 12 of 150 slices shown, 15 images]
[im 12/150  mediastinal]
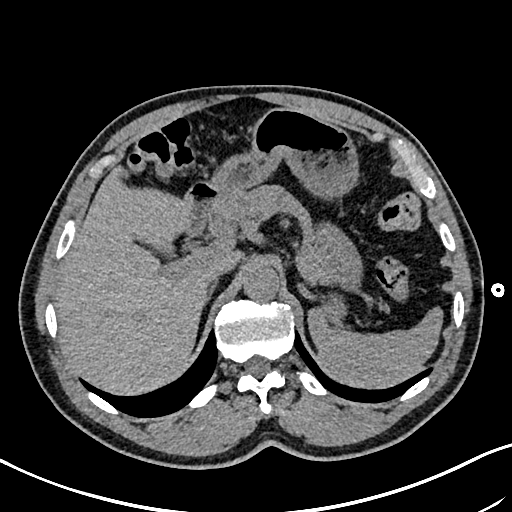
[im 12/150  lung]
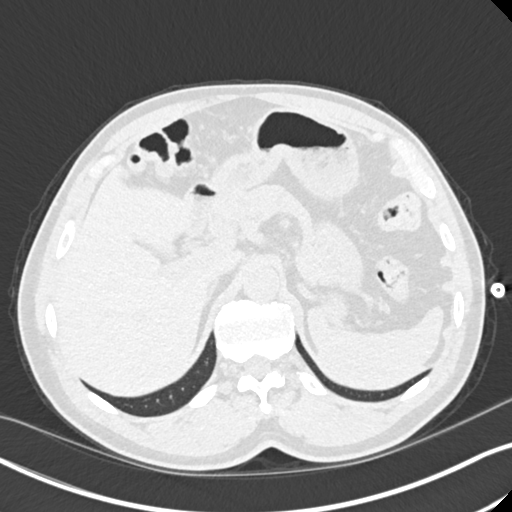
[im 23/150  lung]
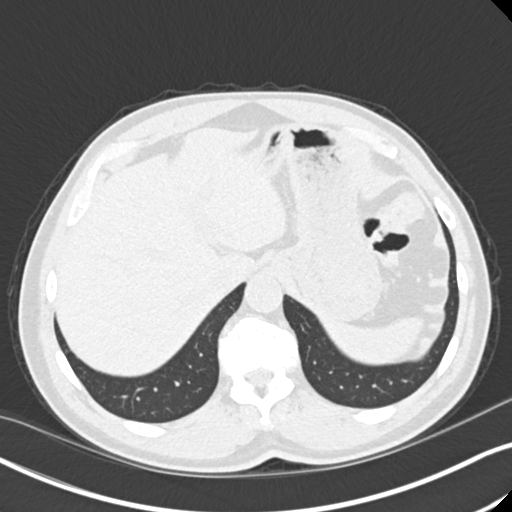
[im 35/150  lung]
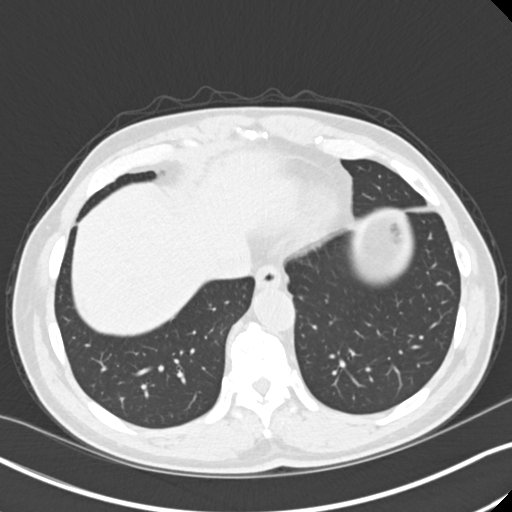
[im 46/150  lung]
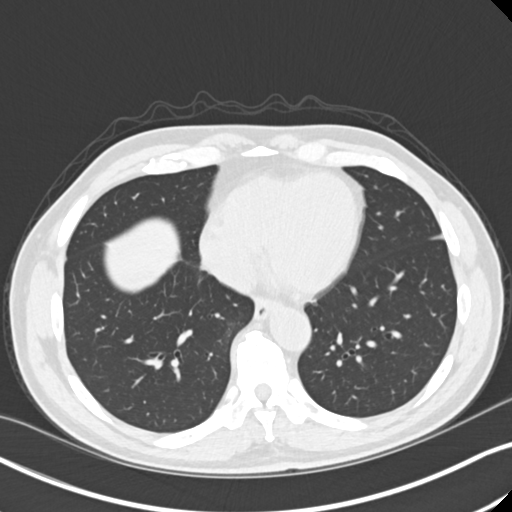
[im 58/150  mediastinal]
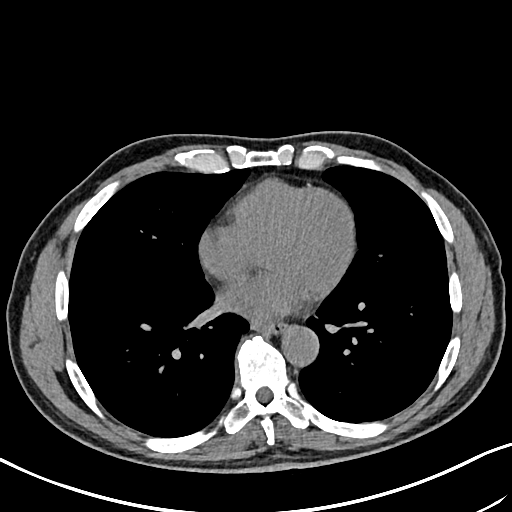
[im 58/150  lung]
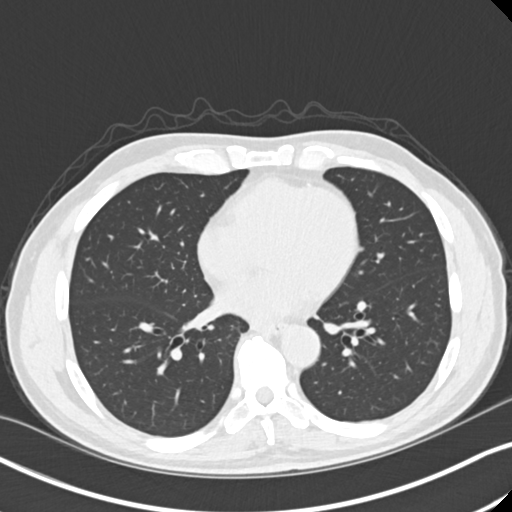
[im 69/150  lung]
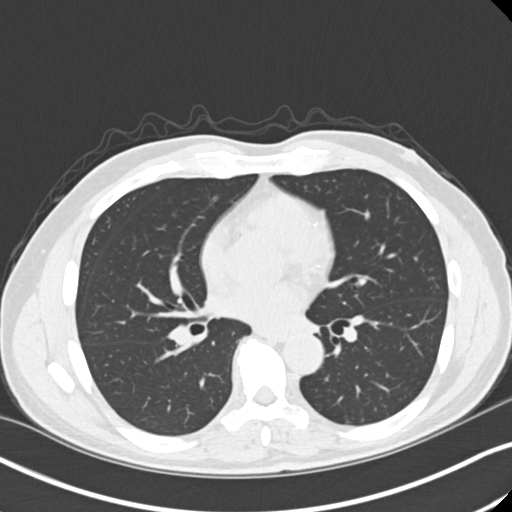
[im 81/150  lung]
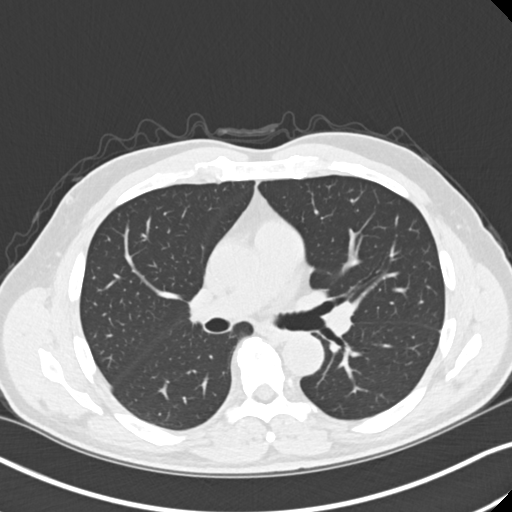
[im 92/150  lung]
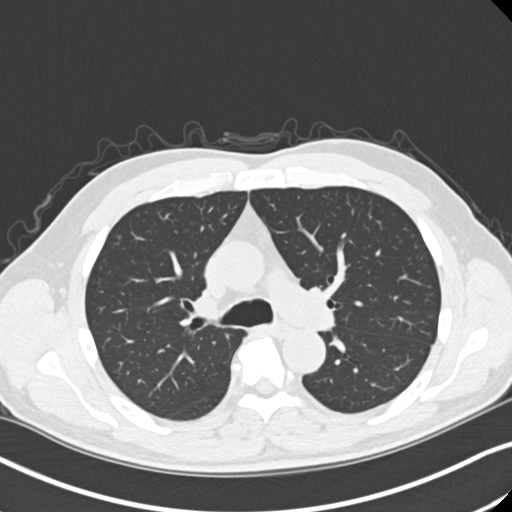
[im 104/150  mediastinal]
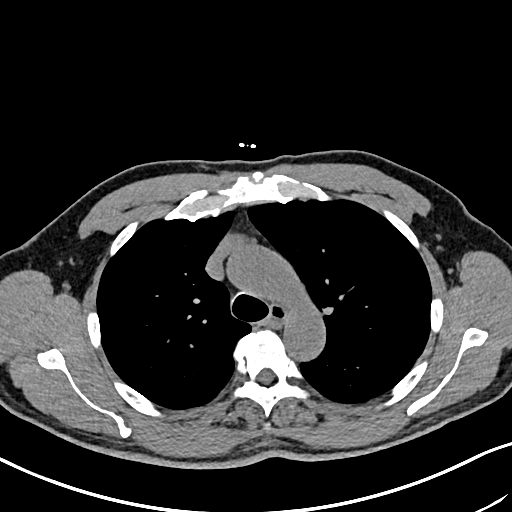
[im 104/150  lung]
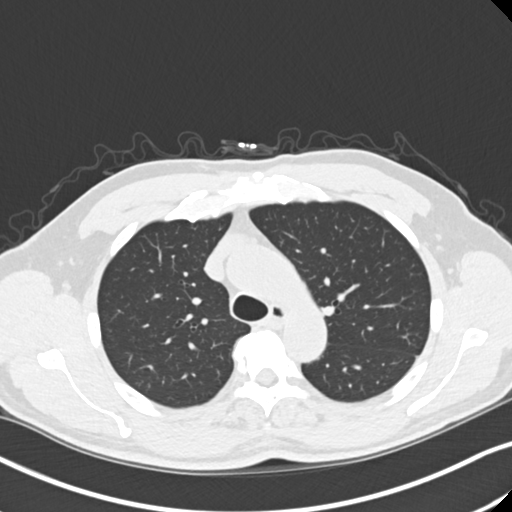
[im 115/150  lung]
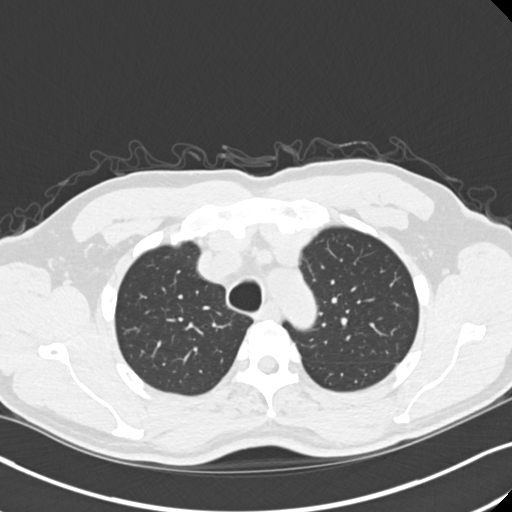
[im 127/150  lung]
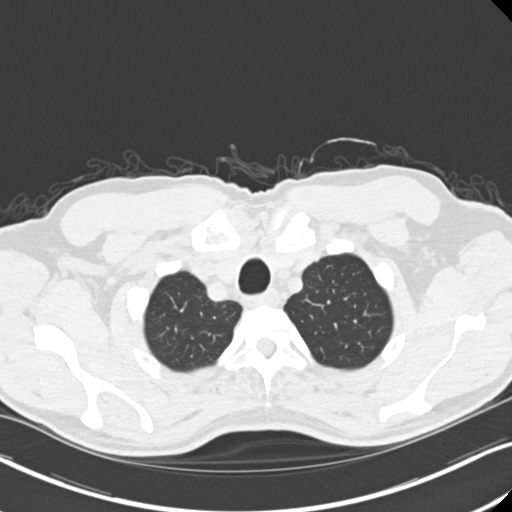
[im 138/150  lung]
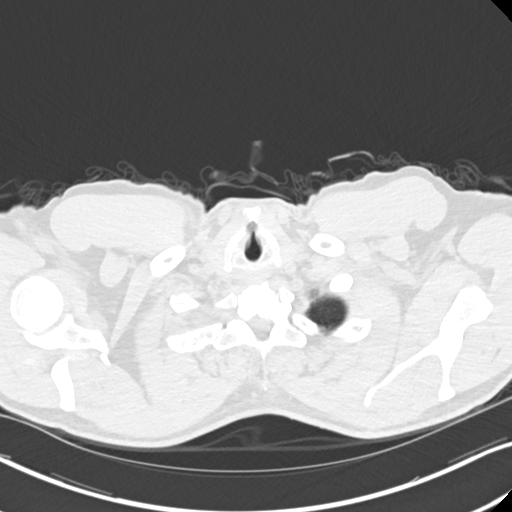

[Series 5: coronals chest 2.00 cor · coronal · 0.59mm/px · 3 of 154 slices shown]
[im 31/154  lung]
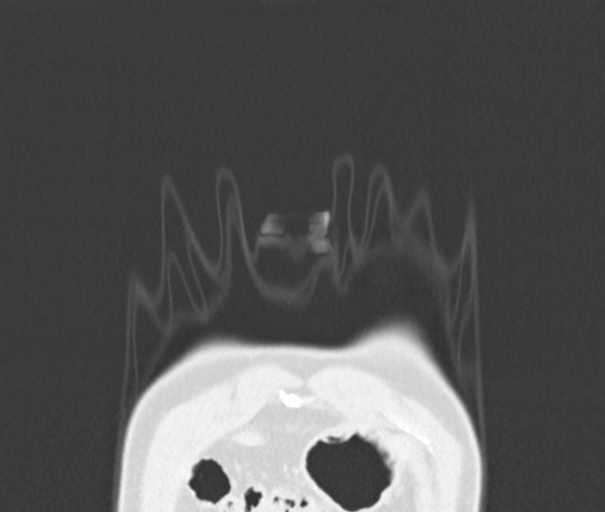
[im 62/154  lung]
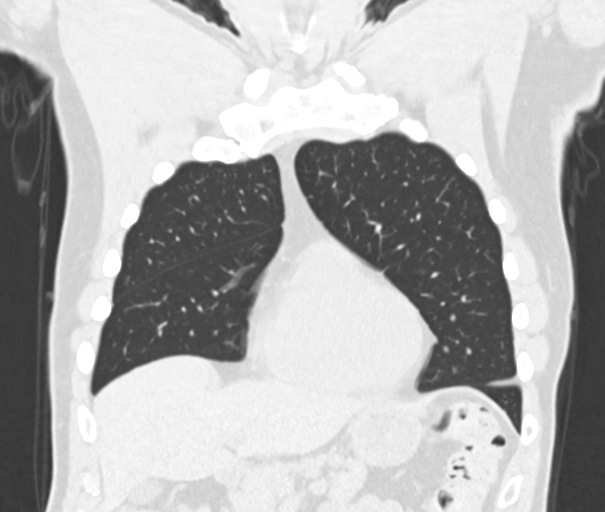
[im 92/154  lung]
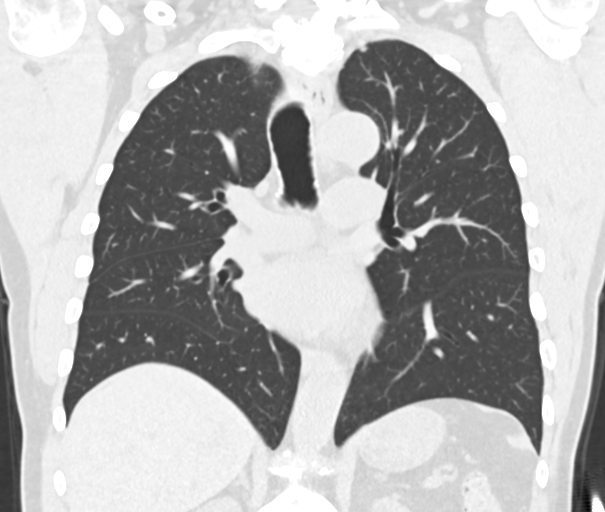

[15 of 36 positions shown; findings below may reference images not displayed]

FINDINGS: Cardiovascular: Heart size is normal. No pericardial effusion.
Thoracic aorta is normal in course and caliber. Scattered
atherosclerotic calcifications of the aorta and coronary arteries.
Main pulmonary trunk is nondilated.

Mediastinum/Nodes: No enlarged mediastinal or axillary lymph nodes.
Thyroid gland, trachea, and esophagus demonstrate no significant
findings.

Lungs/Pleura: Lungs are clear. No pleural effusion or pneumothorax.

Upper Abdomen: No acute abnormality.

Musculoskeletal: Degenerative anterior endplate spurring within the
mid to lower thoracic spine. No acute osseous abnormality. No
suspicious bone lesion.
IMPRESSION: 1. No acute findings within the chest. Lungs are clear.
2. Aortic and coronary artery atherosclerosis ([id number].O).

## 2021-08-22 DIAGNOSIS — K219 Gastro-esophageal reflux disease without esophagitis: Secondary | ICD-10-CM | POA: Insufficient documentation

## 2021-08-23 ENCOUNTER — Encounter: Payer: Self-pay | Admitting: Nurse Practitioner

## 2021-08-23 ENCOUNTER — Ambulatory Visit (INDEPENDENT_AMBULATORY_CARE_PROVIDER_SITE_OTHER): Payer: Managed Care, Other (non HMO) | Admitting: Nurse Practitioner

## 2021-08-23 ENCOUNTER — Other Ambulatory Visit: Payer: Self-pay

## 2021-08-23 VITALS — BP 131/81 | HR 81 | Temp 98.2°F | Ht 68.0 in | Wt 164.0 lb

## 2021-08-23 DIAGNOSIS — I700 Atherosclerosis of aorta: Secondary | ICD-10-CM | POA: Diagnosis not present

## 2021-08-23 DIAGNOSIS — N1831 Chronic kidney disease, stage 3a: Secondary | ICD-10-CM

## 2021-08-23 DIAGNOSIS — D649 Anemia, unspecified: Secondary | ICD-10-CM

## 2021-08-23 DIAGNOSIS — R972 Elevated prostate specific antigen [PSA]: Secondary | ICD-10-CM

## 2021-08-23 DIAGNOSIS — Z0000 Encounter for general adult medical examination without abnormal findings: Secondary | ICD-10-CM

## 2021-08-23 DIAGNOSIS — K219 Gastro-esophageal reflux disease without esophagitis: Secondary | ICD-10-CM

## 2021-08-23 DIAGNOSIS — I10 Essential (primary) hypertension: Secondary | ICD-10-CM

## 2021-08-23 DIAGNOSIS — Z7189 Other specified counseling: Secondary | ICD-10-CM

## 2021-08-23 DIAGNOSIS — Z23 Encounter for immunization: Secondary | ICD-10-CM | POA: Diagnosis not present

## 2021-08-23 DIAGNOSIS — E782 Mixed hyperlipidemia: Secondary | ICD-10-CM

## 2021-08-23 DIAGNOSIS — Z1211 Encounter for screening for malignant neoplasm of colon: Secondary | ICD-10-CM | POA: Insufficient documentation

## 2021-08-23 LAB — MICROALBUMIN, URINE WAIVED
Creatinine, Urine Waived: 200 mg/dL (ref 10–300)
Microalb, Ur Waived: 30 mg/L — ABNORMAL HIGH (ref 0–19)
Microalb/Creat Ratio: <30 mg/g (ref <30)

## 2021-08-23 MED ORDER — LOSARTAN POTASSIUM 25 MG PO TABS: 25.0000 mg | ORAL_TABLET | Freq: Every day | ORAL | 4 refills | Status: DC

## 2021-08-23 MED ORDER — ATORVASTATIN CALCIUM 10 MG PO TABS: 10.0000 mg | ORAL_TABLET | Freq: Every day | ORAL | 4 refills | Status: DC

## 2021-08-23 NOTE — Assessment & Plan Note (Signed)
Chronic, ongoing.  Stable without medication at this time.  Will check Mag level as did take Pepcid for period of time.  Recommend he take this as needed.

## 2021-08-23 NOTE — Assessment & Plan Note (Signed)
A voluntary discussion about advance care planning including the explanation and discussion of advance directives was extensively discussed  with the patient for 15 minutes with patient present.  Explanation about the health care proxy and Living will was reviewed and packet with forms with explanation of how to fill them out was given.  During this discussion, the patient was able to identify a health care proxy as unsure and plans to fill out the paperwork required.  Patient was offered a separate Advance Care Planning visit for further assistance with forms.

## 2021-08-23 NOTE — Assessment & Plan Note (Signed)
Continue collaboration with urology, Dr. Evelene Croon, recent notes reviewed.

## 2021-08-23 NOTE — Assessment & Plan Note (Addendum)
Chronic, ongoing with stable BP.  Recommend he monitor BP at least a few mornings a week at home and document.  DASH diet at home.  Continue current medication regimen and adjust as needed.  Labs today: CBC, CMP, TSH, urine albumin.  Refills sent in.  Return in 6 months.

## 2021-08-23 NOTE — Patient Instructions (Signed)

## 2021-08-23 NOTE — Assessment & Plan Note (Signed)
Chronic, stable levels at this time.  Continue ARB for kidney function.  Recommend increase water intake at home and decrease Dr. Reino Kent + avoid Ibuprofen products.  CMP, CBC, PTH, and urine ALB + iron/ferritin today.  Refer to nephrology if worsening disease presents.

## 2021-08-23 NOTE — Progress Notes (Signed)
BP 131/81   Pulse 81   Temp 98.2 F (36.8 C)   Ht 5\' 8"  (1.727 m)   Wt 164 lb (74.4 kg)   SpO2 97%   BMI 24.94 kg/m    Subjective:    Patient ID: Nicholas Shah, male    DOB: 09-Aug-1951, 70 y.o.   MRN: [id number]  HPI: Nicholas Shah is a 70 y.o. male presenting on 08/23/2021 for comprehensive medical examination. Current medical complaints include:none  He currently lives with: significant other Interim Problems from his last visit: no  Continues to be followed by urology for elevated PSA in past.  Last seen by Dr. 13/01/2021 with a biopsy on 07/19/20.  HYPERTENSION / HYPERLIPIDEMIA Currently taking Losartan 25 MG and Atorvastatin 10 MG. Aortic atherosclerosis noted on imaging 02/12/21. Satisfied with current treatment? yes Duration of hypertension: chronic BP monitoring frequency: not checking BP range:  BP medication side effects: no Duration of hyperlipidemia: chronic Aspirin: no Recent stressors: no Recurrent headaches: no Visual changes: no Palpitations: no Dyspnea: no Chest pain: no Lower extremity edema: no Dizzy/lightheaded: no  The 10-year ASCVD risk score (Arnett DK, et al., 2019) is: 16.9%   Values used to calculate the score:     Age: 73 years     Sex: Male     Is Non-Hispanic African American: Yes     Diabetic: No     Tobacco smoker: No     Systolic Blood Pressure: 131 mmHg     Is BP treated: Yes     HDL Cholesterol: 62 mg/dL     Total Cholesterol: 156 mg/dL  GERD No current medications. Cough has improved. GERD control status: stable Satisfied with current treatment? yes Heartburn frequency: none Medication side effects: no  Medication compliance: stable Dysphagia: no Odynophagia:  no Hematemesis: no Blood in stool: no EGD: no   CHRONIC KIDNEY DISEASE Noted past labs and monitoring. CKD status: stable Medications renally dose: yes Previous renal evaluation: no Pneumovax:  Up to Date Influenza Vaccine:  Up to Date     Functional  Status Survey: Is the patient deaf or have difficulty hearing?: No Does the patient have difficulty seeing, even when wearing glasses/contacts?: No Does the patient have difficulty concentrating, remembering, or making decisions?: No Does the patient have difficulty walking or climbing stairs?: No Does the patient have difficulty dressing or bathing?: No Does the patient have difficulty doing errands alone such as visiting a doctor's office or shopping?: No  FALL RISK: Fall Risk  08/23/2021 11/23/2020 10/25/2020 03/29/2020  Falls in the past year? 0 0 0 0  Number falls in past yr: 0 - - 0  Injury with Fall? 0 - - 0  Risk for fall due to : No Fall Risks - - -  Follow up Falls evaluation completed - - Falls evaluation completed    Depression Screen Depression screen [facility] 2/9 08/23/2021 11/23/2020 10/25/2020 03/29/2020  Decreased Interest 0 0 0 0  Down, Depressed, Hopeless 0 0 0 0  PHQ - 2 Score 0 0 0 0  Altered sleeping - 0 - -  Tired, decreased energy - 0 - -  Change in appetite - 0 - -  Feeling bad or failure about yourself  - 0 - -  Trouble concentrating - 0 - -  Moving slowly or fidgety/restless - 0 - -  Suicidal thoughts - 0 - -  PHQ-9 Score - 0 - -    Advanced Directives A voluntary discussion about advance care  planning including the explanation and discussion of advance directives was extensively discussed  with the patient for 15 minutes with patient.  Explanation about the health care proxy and Living will was reviewed and packet with forms with explanation of how to fill them out was given.  During this discussion, the patient was able to identify a health care proxy as unsure and plans to fill out the paperwork required.  Patient was offered a separate [place] visit for further assistance with forms.     Past Medical History:  Past Medical History:  Diagnosis Date   Allergy    Arthritis     Surgical History:  Past Surgical History:  Procedure Laterality Date    APPENDECTOMY     COLONOSCOPY     CYSTOSCOPY WITH INSERTION OF UROLIFT N/A 05/11/2018   Procedure: CYSTOSCOPY WITH INSERTION OF UROLIFT;  Surgeon: Royston Cowper, MD;  Location: [facility] ORS;  Service: Urology;  Laterality: N/A;   FACIAL RECONSTRUCTION SURGERY  1974   WIRES   KNEE ARTHROSCOPY Right 04/17/2016   Procedure: ARTHROSCOPY KNEE, MEDIAL AND LATERAL PARTIAL MENISECTOMIES;  Surgeon: Thornton Park, MD;  Location: [facility] ORS;  Service: Orthopedics;  Laterality: Right;   MINOR CARPAL TUNNEL     PROSTATE BIOPSY N/A 07/19/2020   Procedure: PROSTATE BIOPSY Pearson Forster;  Surgeon: Royston Cowper, MD;  Location: [facility] OR[facility];  Service: Urology;  Laterality: N/A;   TRIGGER FINGER RELEASE      Medications:  Current Outpatient Medications on File Prior to Visit  Medication Sig   fluticasone (FLONASE) 50 MCG/ACT nasal spray Place 2 sprays into both nostrils daily.   loratadine (CLARITIN) 10 MG tablet Take 1 tablet (10 mg total) by mouth daily.   Nutritional Supplements (PROSTATE 2.4 PO) Take 2 tablets by mouth daily.    No current facility-administered medications on file prior to visit.    Allergies:  No Known Allergies  Social History:  Social History   Socioeconomic History   Marital status: Married    Spouse name: Not on file   Number of children: Not on file   Years of education: Not on file   Highest education level: Not on file  Occupational History   Not on file  Tobacco Use   Smoking status: Never   Smokeless tobacco: Never  Vaping Use   Vaping Use: Never used  Substance and Sexual Activity   Alcohol use: No    Alcohol/week: 0.0 standard drinks   Drug use: No   Sexual activity: Yes  Other Topics Concern   Not on file  Social History Narrative   Not on file   Social Determinants of Health   Financial Resource Strain: Low Risk    Difficulty of Paying Living Expenses: Not hard at all  Food Insecurity: No Food Insecurity   Worried About Charity fundraiser in the  Last Year: Never true   [place] in the Last Year: Never true  Transportation Needs: No Transportation Needs   Lack of Transportation (Medical): No   Lack of Transportation (Non-Medical): No  Physical Activity: Sufficiently Active   Days of Exercise per Week: 4 days   Minutes of Exercise per Session: 40 min  Stress: No Stress Concern Present   Feeling of Stress : Only a little  Social Connections: Moderately Isolated   Frequency of Communication with Friends and Family: More than three times a week   Frequency of Social Gatherings with Friends and Family: More than three  times a week   Attends Religious Services: Never   Active Member of Clubs or Organizations: No   Attends Music therapist: Never   Marital Status: Married  Human resources officer Violence: Not At Risk   Fear of Current or Ex-Partner: No   Emotionally Abused: No   Physically Abused: No   Sexually Abused: No   Social History   Tobacco Use  Smoking Status Never  Smokeless Tobacco Never   Social History   Substance and Sexual Activity  Alcohol Use No   Alcohol/week: 0.0 standard drinks    Family History:  Family History  Problem Relation Age of Onset   CAD Father    Lung cancer Father    Heart failure Father    Diabetes Maternal Grandfather    Diabetes Paternal Grandmother    CAD Paternal Grandfather    Heart disease Paternal Grandfather     Past medical history, surgical history, medications, allergies, family history and social history reviewed with patient today and changes made to appropriate areas of the chart.   ROS All other ROS negative except what is listed above and in the HPI.      Objective:    BP 131/81   Pulse 81   Temp 98.2 F (36.8 C)   Ht 5\' 8"  (1.727 m)   Wt 164 lb (74.4 kg)   SpO2 97%   BMI 24.94 kg/m   Wt Readings from Last 3 Encounters:  08/23/21 164 lb (74.4 kg)  05/06/21 164 lb (74.4 kg)  02/18/21 164 lb 6.4 oz (74.6 kg)    Physical Exam Vitals  and nursing note reviewed.  Constitutional:      General: He is awake. He is not in acute distress.    Appearance: He is well-developed and well-groomed. He is not ill-appearing or toxic-appearing.  HENT:     Head: Normocephalic and atraumatic.     Right Ear: Hearing, tympanic membrane, ear canal and external ear normal. No drainage.     Left Ear: Hearing, tympanic membrane, ear canal and external ear normal. No drainage.     Nose: Nose normal.     Mouth/Throat:     Pharynx: Uvula midline.  Eyes:     General: Lids are normal.        Right eye: No discharge.        Left eye: No discharge.     Extraocular Movements: Extraocular movements intact.     Conjunctiva/sclera: Conjunctivae normal.     Pupils: Pupils are equal, round, and reactive to light.     Visual Fields: Right eye visual fields normal and left eye visual fields normal.  Neck:     Thyroid: No thyromegaly.     Vascular: No carotid bruit or JVD.     Trachea: Trachea normal.  Cardiovascular:     Rate and Rhythm: Normal rate and regular rhythm.     Heart sounds: Normal heart sounds, S1 normal and S2 normal. No murmur heard.   No gallop.  Pulmonary:     Effort: Pulmonary effort is normal. No accessory muscle usage or respiratory distress.     Breath sounds: Normal breath sounds.  Abdominal:     General: Bowel sounds are normal.     Palpations: Abdomen is soft. There is no hepatomegaly or splenomegaly.     Tenderness: There is no abdominal tenderness.  Musculoskeletal:        General: Normal range of motion.     Cervical back: Normal range of  motion and neck supple.     Right lower leg: No edema.     Left lower leg: No edema.  Lymphadenopathy:     Head:     Right side of head: No submental, submandibular, tonsillar, preauricular or posterior auricular adenopathy.     Left side of head: No submental, submandibular, tonsillar, preauricular or posterior auricular adenopathy.     Cervical: No cervical adenopathy.  Skin:     General: Skin is warm and dry.     Capillary Refill: Capillary refill takes less than 2 seconds.     Findings: No rash.  Neurological:     Mental Status: He is alert and oriented to person, place, and time.     Gait: Gait is intact.     Deep Tendon Reflexes: Reflexes are normal and symmetric.     Reflex Scores:      Brachioradialis reflexes are 2+ on the right side and 2+ on the left side.      Patellar reflexes are 2+ on the right side and 2+ on the left side. Psychiatric:        Attention and Perception: Attention normal.        Mood and Affect: Mood normal.        Speech: Speech normal.        Behavior: Behavior normal. Behavior is cooperative.        Thought Content: Thought content normal.        Cognition and Memory: Cognition normal.   Cognitive Testing - 6-CIT  Correct? Score   What year is it? yes 0 Yes = 0    No = 4  What month is it? yes 0 Yes = 0    No = 3  Remember:     Pia Mau, [place], [place]     What time is it? yes 0 Yes = 0    No = 3  Count backwards from 20 to 1 yes 0 Correct = 0    1 error = 2   More than 1 error = 4  Say the months of the year in reverse. yes 0 Correct = 0    1 error = 2   More than 1 error = 4  What address did I ask you to remember? yes 0 Correct = 0  1 error = 2    2 error = 4    3 error = 6    4 error = 8    All wrong = 10       TOTAL SCORE  0/28   Interpretation:  Normal  Normal (0-7) Abnormal (8-28)   Results for orders placed or performed in visit on 05/06/21  Glucose, random  Result Value Ref Range   Glucose 90 65 - 99 mg/dL  Lipid panel  Result Value Ref Range   Cholesterol, Total 156 100 - 199 mg/dL   Triglycerides 80 0 - 149 mg/dL   HDL 62 >39 mg/dL   VLDL Cholesterol Cal 15 5 - 40 mg/dL   LDL Chol Calc (NIH) 79 0 - 99 mg/dL   Chol/HDL Ratio 2.5 0.0 - 5.0 ratio      Assessment & Plan:   Problem List Items Addressed This Visit       Cardiovascular and Mediastinum   Aortic atherosclerosis ([place])    Noted  on CT imaging 02/12/21 -- educated patient on finding.  Recommend continue statin daily for prevention + add on daily Baby ASA  81 MG.        Relevant Medications   atorvastatin (LIPITOR) 10 MG tablet   losartan (COZAAR) 25 MG tablet   Essential hypertension    Chronic, ongoing with stable BP.  Recommend he monitor BP at least a few mornings a week at home and document.  DASH diet at home.  Continue current medication regimen and adjust as needed.  Labs today: CBC, CMP, TSH, urine albumin.  Refills sent in.  Return in 6 months.        Relevant Medications   atorvastatin (LIPITOR) 10 MG tablet   losartan (COZAAR) 25 MG tablet   Other Relevant Orders   Comprehensive metabolic panel   TSH     Digestive   Gastroesophageal reflux disease without esophagitis    Chronic, ongoing.  Stable without medication at this time.  Will check Mag level as did take Pepcid for period of time.  Recommend he take this as needed.      Relevant Orders   Magnesium     Genitourinary   Stage 3a chronic kidney disease ([place]) - Primary    Chronic, stable levels at this time.  Continue ARB for kidney function.  Recommend increase water intake at home and decrease Dr. Malachi Bonds + avoid Ibuprofen products.  CMP, CBC, PTH, and urine ALB + iron/ferritin today.  Refer to nephrology if worsening disease presents.      Relevant Orders   Microalbumin, Urine Waived   CBC with Differential/Platelet   Comprehensive metabolic panel   PTH, Intact and Calcium     Other   Elevated PSA    Continue collaboration with urology, Dr. Yves Dill, recent notes reviewed.      Hyperlipidemia    Chronic, ongoing.  Has significant family history with father and grandfather having strokes.  Continue daily statin at this time and adjust as needed.  Lipid panel today. Goal LDL <70 for this patient.  Return in 6 months for this.      Relevant Medications   atorvastatin (LIPITOR) 10 MG tablet   losartan (COZAAR) 25 MG tablet   Other  Relevant Orders   Comprehensive metabolic panel   Lipid Panel w/o Chol/HDL Ratio   Other Visit Diagnoses     Low hemoglobin       Noted on recent labs, check CBC and iron/ferritin today.  Has underlying CKD.   Relevant Orders   CBC with Differential/Platelet   Iron, TIBC and Ferritin Panel   Pneumococcal vaccination given       PCV13 today, educated on this.   Relevant Orders   Pneumococcal conjugate vaccine 13-valent   Encounter for annual physical exam       Annual physical with labs today.  Health maintenance reviewed -- refuses colonoscopy and Shingrix.        Discussed aspirin prophylaxis for myocardial infarction prevention and decision was it was not indicated  LABORATORY TESTING:  Health maintenance labs ordered today as discussed above.   The natural history of prostate cancer and ongoing controversy regarding screening and potential treatment outcomes of prostate cancer has been discussed with the patient. The meaning of a false positive PSA and a false negative PSA has been discussed. He indicates understanding of the limitations of this screening test and wishes to proceed with screening PSA testing -- done with urology.   IMMUNIZATIONS:   - Tdap: Tetanus vaccination status reviewed: last tetanus booster within 10 years. - Influenza: Up to date - Pneumovax: Not applicable - Prevnar: Administered today -  Zostavax vaccine: Refused  SCREENING: - Colonoscopy: Refused -- refuses Cologuard as well today. Discussed with patient purpose of the colonoscopy is to detect colon cancer at curable precancerous or early stages   - AAA Screening: Not applicable  -Hearing Test: Not applicable  -Spirometry: Not applicable   PATIENT COUNSELING:    Sexuality: Discussed sexually transmitted diseases, partner selection, use of condoms, avoidance of unintended pregnancy  and contraceptive alternatives.   Advised to avoid cigarette smoking.  I discussed with the patient that  most people either abstain from alcohol or drink within safe limits (<=14/week and <=4 drinks/occasion for males, <=7/weeks and <= 3 drinks/occasion for females) and that the risk for alcohol disorders and other health effects rises proportionally with the number of drinks per week and how often a drinker exceeds daily limits.  Discussed cessation/primary prevention of drug use and availability of treatment for abuse.   Diet: Encouraged to adjust caloric intake to maintain  or achieve ideal body weight, to reduce intake of dietary saturated fat and total fat, to limit sodium intake by avoiding high sodium foods and not adding table salt, and to maintain adequate dietary potassium and calcium preferably from fresh fruits, vegetables, and low-fat dairy products.    stressed the importance of regular exercise  Injury prevention: Discussed safety belts, safety helmets, smoke detector, smoking near bedding or upholstery.   Dental health: Discussed importance of regular tooth brushing, flossing, and dental visits.   Follow up plan: NEXT PREVENTATIVE PHYSICAL DUE IN 1 YEAR. Return in about 6 months (around 02/20/2022) for HTN/HLD, PROSTATE CANCER.

## 2021-08-23 NOTE — Assessment & Plan Note (Signed)
Noted on CT imaging 02/12/21 -- educated patient on finding.  Recommend continue statin daily for prevention + add on daily Baby ASA 81 MG.

## 2021-08-23 NOTE — Assessment & Plan Note (Signed)
Chronic, ongoing.  Has significant family history with father and grandfather having strokes.  Continue daily statin at this time and adjust as needed.  Lipid panel today. Goal LDL <70 for this patient.  Return in 6 months for this. 

## 2021-08-24 LAB — CBC WITH DIFFERENTIAL/PLATELET
Basophils Absolute: 0 10*3/uL (ref 0.0–0.2)
Basos: 1 %
EOS (ABSOLUTE): 0.1 10*3/uL (ref 0.0–0.4)
Eos: 2 %
Hematocrit: 44.7 % (ref 37.5–51.0)
Hemoglobin: 15.4 g/dL (ref 13.0–17.7)
Immature Grans (Abs): 0 10*3/uL (ref 0.0–0.1)
Immature Granulocytes: 0 %
Lymphocytes Absolute: 0.9 10*3/uL (ref 0.7–3.1)
Lymphs: 24 %
MCH: 31.4 pg (ref 26.6–33.0)
MCHC: 34.5 g/dL (ref 31.5–35.7)
MCV: 91 fL (ref 79–97)
Monocytes Absolute: 0.3 10*3/uL (ref 0.1–0.9)
Monocytes: 8 %
Neutrophils Absolute: 2.5 10*3/uL (ref 1.4–7.0)
Neutrophils: 65 %
Platelets: 238 10*3/uL (ref 150–450)
RBC: 4.9 x10E6/uL (ref 4.14–5.80)
RDW: 12.2 % (ref 11.6–15.4)
WBC: 3.8 10*3/uL (ref 3.4–10.8)

## 2021-08-24 LAB — COMPREHENSIVE METABOLIC PANEL
ALT: 19 IU/L (ref 0–44)
AST: 16 IU/L (ref 0–40)
Albumin/Globulin Ratio: 2.1 (ref 1.2–2.2)
Albumin: 5 g/dL — ABNORMAL HIGH (ref 3.8–4.8)
Alkaline Phosphatase: 74 IU/L (ref 44–121)
BUN/Creatinine Ratio: 16 (ref 10–24)
BUN: 20 mg/dL (ref 8–27)
Bilirubin Total: 0.9 mg/dL (ref 0.0–1.2)
CO2: 26 mmol/L (ref 20–29)
Calcium: 9.7 mg/dL (ref 8.6–10.2)
Chloride: 103 mmol/L (ref 96–106)
Creatinine, Ser: 1.27 mg/dL (ref 0.76–1.27)
Globulin, Total: 2.4 g/dL (ref 1.5–4.5)
Glucose: 89 mg/dL (ref 70–99)
Potassium: 4.4 mmol/L (ref 3.5–5.2)
Sodium: 143 mmol/L (ref 134–144)
Total Protein: 7.4 g/dL (ref 6.0–8.5)
eGFR: 61 mL/min/{1.73_m2} (ref >59)

## 2021-08-24 LAB — MAGNESIUM: Magnesium: 2.3 mg/dL (ref 1.6–2.3)

## 2021-08-24 LAB — IRON,TIBC AND FERRITIN PANEL
Ferritin: 407 ng/mL — ABNORMAL HIGH (ref 30–400)
Iron Saturation: 34 % (ref 15–55)
Iron: 100 ug/dL (ref 38–169)
Total Iron Binding Capacity: 295 ug/dL (ref 250–450)
UIBC: 195 ug/dL (ref 111–343)

## 2021-08-24 LAB — LIPID PANEL W/O CHOL/HDL RATIO
Cholesterol, Total: 168 mg/dL (ref 100–199)
HDL: 69 mg/dL (ref >39)
LDL Chol Calc (NIH): 85 mg/dL (ref 0–99)
Triglycerides: 77 mg/dL (ref 0–149)
VLDL Cholesterol Cal: 14 mg/dL (ref 5–40)

## 2021-08-24 LAB — PTH, INTACT AND CALCIUM: PTH: 29 pg/mL (ref 15–65)

## 2021-08-24 LAB — TSH: TSH: 0.903 u[IU]/mL (ref 0.450–4.500)

## 2021-08-24 NOTE — Progress Notes (Signed)
Contacted via MyChart -- but please call and ensure he received message, not sure he checks My Chart: Hey Nicholas Shah, your labs have returned and overall they are nice and stable -- even kidney function is in normal levels this check.  I would continue all current medications as they are making a difference.  Any questions? Keep being amazing!!  Thank you for allowing me to participate in your care.  I appreciate you. Kindest regards, Giah Fickett

## 2021-12-06 ENCOUNTER — Ambulatory Visit: Payer: Self-pay

## 2021-12-06 NOTE — Telephone Encounter (Signed)
Chief Complaint: COVID Symptoms: cough, congestion, chills Frequency: since 12/03/21 Pertinent Negatives: Patient denies SOB Disposition: _0 ED /_1 Urgent Care (no appt availability in office) / _2 Appointment(In office/virtual)/ _3  [facility]/ _4 Home Care/ _5 Refused Recommended Disposition /_6 [facility]/ _7  Follow-up with PCP Additional Notes: pt given care advice and advised to try OTC meds first and if doesn't help and symptoms still present come Monday call office back and will schedule virtual visit. Tried to get pt to do VUC but he didn't have access to his mychart and no way to access it at home.    Reason for Disposition  [1] COVID-19 diagnosed by positive lab test (e.g., PCR, rapid self-test kit) AND [2] mild symptoms (e.g., cough, fever, others) AND [7] no complications or SOB  Answer Assessment - Initial Assessment Questions 1. COVID-19 DIAGNOSIS: "Who made your COVID-19 diagnosis?" "Was it confirmed by a positive lab test or self-test?" If not diagnosed by a doctor (or NP/PA), ask "Are there lots of cases (community spread) where you live?" Note: See public health department website, if unsure.     Home test today 3. ONSET: "When did the COVID-19 symptoms start?"      Tuesday 5. COUGH: "Do you have a cough?" If Yes, ask: "How bad is the cough?"       yes 6. FEVER: "Do you have a fever?" If Yes, ask: "What is your temperature, how was it measured, and when did it start?"     Hadnt checked 7. RESPIRATORY STATUS: "Describe your breathing?" (e.g., shortness of breath, wheezing, unable to speak)      No 9. HIGH RISK DISEASE: "Do you have any chronic medical problems?" (e.g., asthma, heart or lung disease, weak immune system, obesity, etc.)     No 10. VACCINE: "Have you had the COVID-19 vaccine?" If Yes, ask: "Which one, how many shots, when did you get it?"       yes 11. BOOSTER: "Have you received your COVID-19 booster?" If Yes, ask: "Which one and when did  you get it?"       Yes 1 13. OTHER SYMPTOMS: "Do you have any other symptoms?"  (e.g., chills, fatigue, headache, loss of smell or taste, muscle pain, sore throat)       Congestion cough, chills  Protocols used: Coronavirus (COVID-19) Diagnosed or Suspected-A-AH

## 2022-02-16 NOTE — Patient Instructions (Signed)

## 2022-02-20 ENCOUNTER — Encounter: Payer: Self-pay | Admitting: Nurse Practitioner

## 2022-02-20 ENCOUNTER — Ambulatory Visit (INDEPENDENT_AMBULATORY_CARE_PROVIDER_SITE_OTHER): Payer: Managed Care, Other (non HMO) | Admitting: Nurse Practitioner

## 2022-02-20 VITALS — BP 113/64 | HR 90 | Temp 98.4°F | Wt 170.2 lb

## 2022-02-20 DIAGNOSIS — I700 Atherosclerosis of aorta: Secondary | ICD-10-CM

## 2022-02-20 DIAGNOSIS — N1831 Chronic kidney disease, stage 3a: Secondary | ICD-10-CM | POA: Diagnosis not present

## 2022-02-20 DIAGNOSIS — R972 Elevated prostate specific antigen [PSA]: Secondary | ICD-10-CM

## 2022-02-20 DIAGNOSIS — I10 Essential (primary) hypertension: Secondary | ICD-10-CM | POA: Diagnosis not present

## 2022-02-20 DIAGNOSIS — Z23 Encounter for immunization: Secondary | ICD-10-CM | POA: Diagnosis not present

## 2022-02-20 DIAGNOSIS — E782 Mixed hyperlipidemia: Secondary | ICD-10-CM

## 2022-02-20 DIAGNOSIS — J301 Allergic rhinitis due to pollen: Secondary | ICD-10-CM

## 2022-02-20 MED ORDER — FLUTICASONE PROPIONATE 50 MCG/ACT NA SUSP: 2.0000 | Freq: Every day | NASAL | 6 refills | Status: DC

## 2022-02-20 MED ORDER — LORATADINE 10 MG PO TABS: 10.0000 mg | ORAL_TABLET | Freq: Every day | ORAL | 4 refills | Status: DC

## 2022-02-20 NOTE — Assessment & Plan Note (Signed)
Chronic, ongoing with stable BP.  Recommend he monitor BP at least a few mornings a week at home and document.  DASH diet at home.  Continue current medication regimen and adjust as needed.  Labs today: CMP and lipid.  Refills up to date.  Return in 6 months. ? ? ?

## 2022-02-20 NOTE — Assessment & Plan Note (Signed)
Noted on CT imaging 02/12/21 -- educated patient on finding.  Recommend continue statin daily for prevention + daily Baby ASA 81 MG.   

## 2022-02-20 NOTE — Assessment & Plan Note (Signed)
Continue collaboration with urology, Dr. Evelene Croon, will attempt to obtain recent records and labs from them. ?

## 2022-02-20 NOTE — Assessment & Plan Note (Addendum)
Chronic, improved with Losartan on board.  Continue ARB for kidney function.  Recommend increase water intake at home + avoid Ibuprofen products.  CMP today.  Refer to nephrology if worsening disease presents. ?

## 2022-02-20 NOTE — Assessment & Plan Note (Signed)
Chronic, ongoing.  Has significant family history with father and grandfather having strokes.  Continue daily statin at this time and adjust as needed.  Lipid panel today. Goal LDL <70 for this patient.  Return in 6 months for this. 

## 2022-02-20 NOTE — Assessment & Plan Note (Signed)
Chronic, ongoing.  Recommend continue plain Claritin without the D on a daily basis + Flonase -- refills sent, recommend he use consistently and daily.   Adjust regimen as needed.  Return in 6 months. ?

## 2022-02-20 NOTE — Progress Notes (Signed)
? ?BP 113/64   Pulse 90   Temp 98.4 ?F (36.9 ?C) (Oral)   Wt 170 lb 3.2 oz (77.2 kg)   SpO2 96%   BMI 25.88 kg/m?   ? ?Subjective:  ? ? Patient ID: Nicholas Shah, male    DOB: 08-13-1951, 71 y.o.   MRN: [id number] ? ?HPI: ?Nicholas Shah is a 71 y.o. male ? ?Chief Complaint  ?Patient presents with  ? Hypertension  ? Hyperlipidemia  ? Chronic Kidney Disease  ? ?HYPERTENSION / HYPERLIPIDEMIA ?Currently taking Losartan 25 MG and Atorvastatin 10 MG. Aortic atherosclerosis noted on imaging 02/12/21.  Does need refills today on allergy medications, as theses are exacerbated right now due to pollen. ?Satisfied with current treatment? yes ?Duration of hypertension: chronic ?BP monitoring frequency: not checking ?BP range:  ?BP medication side effects: no ?Duration of hyperlipidemia: chronic ?Aspirin: no ?Recent stressors: no ?Recurrent headaches: no ?Visual changes: no ?Palpitations: no ?Dyspnea: no ?Chest pain: no ?Lower extremity edema: no ?Dizzy/lightheaded: no  ?The 10-year ASCVD risk score (Arnett DK, et al., 2019) is: 13.3% ?  Values used to calculate the score: ?    Age: 61 years ?    Sex: Male ?    Is Non-Hispanic African American: Yes ?    Diabetic: No ?    Tobacco smoker: No ?    Systolic Blood Pressure: 656 mmHg ?    Is BP treated: Yes ?    HDL Cholesterol: 69 mg/dL ?    Total Cholesterol: 168 mg/dL  ?  ?CHRONIC KIDNEY DISEASE ?Noted past labs and monitoring.  Was improved on recent labs with Losartan. ?CKD status: stable ?Medications renally dose: yes ?Previous renal evaluation: no ?Pneumovax:  Up to Date ?Influenza Vaccine:  Up to Date  ? ?ELEVATED PSA: ?Continues to follow with urology, Dr. Yves Dill.  Last saw one month ago he reports, no notes in chart.  PSA remained a little elevated, but they are monitoring.  Has history of biopsies, last one year ago per patient report -- has had x 2.  Family history of prostate cancer in father and grandfather. ?  ? ?Relevant past medical, surgical, family and  social history reviewed and updated as indicated. Interim medical history since our last visit reviewed. ?Allergies and medications reviewed and updated. ? ?Review of Systems  ?Constitutional:  Negative for activity change, diaphoresis, fatigue and fever.  ?Respiratory:  Negative for cough, chest tightness, shortness of breath and wheezing.   ?Cardiovascular:  Negative for chest pain, palpitations and leg swelling.  ?Gastrointestinal: Negative.   ?Musculoskeletal: Negative.   ?Skin: Negative.   ?Neurological: Negative.   ?Psychiatric/Behavioral: Negative.    ? ?Per HPI unless specifically indicated above ? ?   ?Objective:  ?  ?BP 113/64   Pulse 90   Temp 98.4 ?F (36.9 ?C) (Oral)   Wt 170 lb 3.2 oz (77.2 kg)   SpO2 96%   BMI 25.88 kg/m?   ?Wt Readings from Last 3 Encounters:  ?02/20/22 170 lb 3.2 oz (77.2 kg)  ?08/23/21 164 lb (74.4 kg)  ?05/06/21 164 lb (74.4 kg)  ?  ?Physical Exam ?Vitals and nursing note reviewed.  ?Constitutional:   ?   General: He is awake. He is not in acute distress. ?   Appearance: He is well-developed and well-groomed. He is not ill-appearing.  ?HENT:  ?   Head: Normocephalic and atraumatic.  ?   Right Ear: Hearing normal. No drainage.  ?   Left Ear: Hearing normal. No drainage.  ?  Eyes:  ?   General: Lids are normal.     ?   Right eye: No discharge.     ?   Left eye: No discharge.  ?   Conjunctiva/sclera: Conjunctivae normal.  ?   Pupils: Pupils are equal, round, and reactive to light.  ?Neck:  ?   Thyroid: No thyromegaly.  ?   Vascular: No carotid bruit.  ?   Trachea: Trachea normal.  ?Cardiovascular:  ?   Rate and Rhythm: Normal rate and regular rhythm.  ?   Heart sounds: Normal heart sounds, S1 normal and S2 normal. No murmur heard. ?  No gallop.  ?Pulmonary:  ?   Effort: Pulmonary effort is normal. No accessory muscle usage or respiratory distress.  ?   Breath sounds: Normal breath sounds.  ?Abdominal:  ?   General: Bowel sounds are normal.  ?   Palpations: Abdomen is soft.   ?Musculoskeletal:     ?   General: Normal range of motion.  ?   Cervical back: Normal range of motion and neck supple.  ?   Right lower leg: No edema.  ?   Left lower leg: No edema.  ?Skin: ?   General: Skin is warm and dry.  ?   Capillary Refill: Capillary refill takes less than 2 seconds.  ?   Findings: No rash.  ?Neurological:  ?   Mental Status: He is alert and oriented to person, place, and time.  ?Psychiatric:     ?   Attention and Perception: Attention normal.     ?   Mood and Affect: Mood normal.     ?   Speech: Speech normal.     ?   Behavior: Behavior normal. Behavior is cooperative.     ?   Thought Content: Thought content normal.  ? ? ?Results for orders placed or performed in visit on 08/23/21  ?Magnesium  ?Result Value Ref Range  ? Magnesium 2.3 1.6 - 2.3 mg/dL  ?Microalbumin, Urine Waived  ?Result Value Ref Range  ? Microalb, Ur Waived 30 (H) 0 - 19 mg/L  ? Creatinine, Urine Waived 200 10 - 300 mg/dL  ? Microalb/Creat Ratio <30 <30 mg/g  ?CBC with Differential/Platelet  ?Result Value Ref Range  ? WBC 3.8 3.4 - 10.8 x10E3/uL  ? RBC 4.90 4.14 - 5.80 x10E6/uL  ? Hemoglobin 15.4 13.0 - 17.7 g/dL  ? Hematocrit 44.7 37.5 - 51.0 %  ? MCV 91 79 - 97 fL  ? MCH 31.4 26.6 - 33.0 pg  ? MCHC 34.5 31.5 - 35.7 g/dL  ? RDW 12.2 11.6 - 15.4 %  ? Platelets 238 150 - 450 x10E3/uL  ? Neutrophils 65 Not Estab. %  ? Lymphs 24 Not Estab. %  ? Monocytes 8 Not Estab. %  ? Eos 2 Not Estab. %  ? Basos 1 Not Estab. %  ? Neutrophils Absolute 2.5 1.4 - 7.0 x10E3/uL  ? Lymphocytes Absolute 0.9 0.7 - 3.1 x10E3/uL  ? Monocytes Absolute 0.3 0.1 - 0.9 x10E3/uL  ? EOS (ABSOLUTE) 0.1 0.0 - 0.4 x10E3/uL  ? Basophils Absolute 0.0 0.0 - 0.2 x10E3/uL  ? Immature Granulocytes 0 Not Estab. %  ? Immature Grans (Abs) 0.0 0.0 - 0.1 x10E3/uL  ?Comprehensive metabolic panel  ?Result Value Ref Range  ? Glucose 89 70 - 99 mg/dL  ? BUN 20 8 - 27 mg/dL  ? Creatinine, Ser 1.27 0.76 - 1.27 mg/dL  ? eGFR 61 >59 mL/min/1.73  ? BUN/Creatinine Ratio  16 10 -  24  ? Sodium 143 134 - 144 mmol/L  ? Potassium 4.4 3.5 - 5.2 mmol/L  ? Chloride 103 96 - 106 mmol/L  ? CO2 26 20 - 29 mmol/L  ? Calcium 9.7 8.6 - 10.2 mg/dL  ? Total Protein 7.4 6.0 - 8.5 g/dL  ? Albumin 5.0 (H) 3.8 - 4.8 g/dL  ? Globulin, Total 2.4 1.5 - 4.5 g/dL  ? Albumin/Globulin Ratio 2.1 1.2 - 2.2  ? Bilirubin Total 0.9 0.0 - 1.2 mg/dL  ? Alkaline Phosphatase 74 44 - 121 IU/L  ? AST 16 0 - 40 IU/L  ? ALT 19 0 - 44 IU/L  ?Lipid Panel w/o Chol/HDL Ratio  ?Result Value Ref Range  ? Cholesterol, Total 168 100 - 199 mg/dL  ? Triglycerides 77 0 - 149 mg/dL  ? HDL 69 >39 mg/dL  ? VLDL Cholesterol Cal 14 5 - 40 mg/dL  ? LDL Chol Calc (NIH) 85 0 - 99 mg/dL  ?TSH  ?Result Value Ref Range  ? TSH 0.903 0.450 - 4.500 uIU/mL  ?PTH, Intact and Calcium  ?Result Value Ref Range  ? PTH 29 15 - 65 pg/mL  ? PTH Interp Comment   ?Iron, TIBC and Ferritin Panel  ?Result Value Ref Range  ? Total Iron Binding Capacity 295 250 - 450 ug/dL  ? UIBC 195 111 - 343 ug/dL  ? Iron 100 38 - 169 ug/dL  ? Iron Saturation 34 15 - 55 %  ? Ferritin 407 (H) 30 - 400 ng/mL  ? ?   ?Assessment & Plan:  ? ?Problem List Items Addressed This Visit   ? ?  ? Cardiovascular and Mediastinum  ? Aortic atherosclerosis ([place])  ?  Noted on CT imaging 02/12/21 -- educated patient on finding.  Recommend continue statin daily for prevention + daily Baby ASA 81 MG.   ? ?  ?  ? Relevant Orders  ? Basic metabolic panel  ? Lipid Panel w/o Chol/HDL Ratio  ? Essential hypertension  ?  Chronic, ongoing with stable BP.  Recommend he monitor BP at least a few mornings a week at home and document.  DASH diet at home.  Continue current medication regimen and adjust as needed.  Labs today: CMP and lipid.  Refills up to date.  Return in 6 months. ? ? ? ?  ?  ? Relevant Orders  ? Basic metabolic panel  ?  ? Respiratory  ? Allergic rhinitis  ?  Chronic, ongoing.  Recommend continue plain Claritin without the D on a daily basis + Flonase -- refills sent, recommend he use consistently  and daily.   Adjust regimen as needed.  Return in 6 months. ? ?  ?  ?  ? Genitourinary  ? Stage 3a chronic kidney disease ([place]) - Primary  ?  Chronic, improved with Losartan on board.  Continue ARB for kidney function.

## 2022-02-21 ENCOUNTER — Other Ambulatory Visit: Payer: Self-pay | Admitting: Nurse Practitioner

## 2022-02-21 LAB — BASIC METABOLIC PANEL
BUN/Creatinine Ratio: 14 (ref 10–24)
BUN: 20 mg/dL (ref 8–27)
CO2: 25 mmol/L (ref 20–29)
Calcium: 9.9 mg/dL (ref 8.6–10.2)
Chloride: 105 mmol/L (ref 96–106)
Creatinine, Ser: 1.47 mg/dL — ABNORMAL HIGH (ref 0.76–1.27)
Glucose: 81 mg/dL (ref 70–99)
Potassium: 4.5 mmol/L (ref 3.5–5.2)
Sodium: 143 mmol/L (ref 134–144)
eGFR: 51 mL/min/{1.73_m2} — ABNORMAL LOW (ref >59)

## 2022-02-21 LAB — LIPID PANEL W/O CHOL/HDL RATIO
Cholesterol, Total: 146 mg/dL (ref 100–199)
HDL: 66 mg/dL (ref >39)
LDL Chol Calc (NIH): 65 mg/dL (ref 0–99)
Triglycerides: 77 mg/dL (ref 0–149)
VLDL Cholesterol Cal: 15 mg/dL (ref 5–40)

## 2022-02-21 NOTE — Telephone Encounter (Signed)
Refused losartan 25 mg because it was written for a yr worth of refills11/01/2021 #90, 4 refils ?

## 2022-02-21 NOTE — Progress Notes (Signed)
Contacted via [facility] ? ? ?Good afternoon Nicholas Shah, your labs have returned.  There is slight drop in eGFR and rise in creatinine again, showing mild kidney disease.  This is remaining stable though, which is great news.  Cholesterol levels also continue to look great.  Continue all medications as ordered.  There are enough refills to last until past November at [facility]:) ?Keep being amazing!!  Thank you for allowing me to participate in your care.  I appreciate you. ?Kindest regards, ?Allanna Bresee ?

## 2022-04-14 ENCOUNTER — Other Ambulatory Visit: Payer: Self-pay | Admitting: Nurse Practitioner

## 2022-04-15 NOTE — Telephone Encounter (Signed)
Requested medication (s) are due for refill today:   No   Requested medication (s) are on the active medication list:   Yes  Future visit scheduled:   Yes   Last ordered: 08/23/2021 #90, 4 refills  Returned because there are no more refills.  Pt down to last pill today.   Requested Prescriptions  Pending Prescriptions Disp Refills   losartan (COZAAR) 25 MG tablet 90 tablet 4    Sig: Take 1 tablet (25 mg total) by mouth daily. For blood pressure.     Cardiovascular:  Angiotensin Receptor Blockers Failed - 04/14/2022 11:47 AM      Failed - Cr in normal range and within 180 days    Creatinine  Date Value Ref Range Status  04/06/2014 1.37 (H) 0.60 - 1.30 mg/dL Final   Creatinine, Ser  Date Value Ref Range Status  02/20/2022 1.47 (H) 0.76 - 1.27 mg/dL Final         Passed - K in normal range and within 180 days    Potassium  Date Value Ref Range Status  02/20/2022 4.5 3.5 - 5.2 mmol/L Final  04/06/2014 3.8 3.5 - 5.1 mmol/L Final         Passed - Patient is not pregnant      Passed - Last BP in normal range    BP Readings from Last 1 Encounters:  02/20/22 113/64         Passed - Valid encounter within last 6 months    Recent Outpatient Visits           1 month ago Stage 3a chronic kidney disease (HCC)   [facility] Cannady, Jolene T, NP   7 months ago Stage 3a chronic kidney disease (HCC)   [facility] Cannady, Jolene T, NP   1 year ago Aortic atherosclerosis (HCC)   [facility] Cannady, Jolene T, NP   1 year ago Stage 3a chronic kidney disease (HCC)   [facility] Cannady, Jolene T, NP   1 year ago Stage 3a chronic kidney disease (HCC)   [facility] Cannady, Dorie Rank, NP       Future Appointments             In 4 months Cannady, Dorie Rank, NP [facility]

## 2022-04-18 ENCOUNTER — Other Ambulatory Visit: Payer: Self-pay | Admitting: Nurse Practitioner

## 2022-04-18 NOTE — Telephone Encounter (Signed)
Requested medication (s) are due for refill today: no  Requested medication (s) are on the active medication list: yes  Last refill:  08/23/21  Future visit scheduled: yes  Notes to clinic:  Unable to refill per protocol, refill too soon. Last refill 08/23/21 for 90 days and 4 refills.     Requested Prescriptions  Pending Prescriptions Disp Refills   losartan (COZAAR) 25 MG tablet [Pharmacy Med Name: Losartan Potassium 25 MG Oral Tablet] 90 tablet 0    Sig: Take 1 tablet by mouth once daily     Cardiovascular:  Angiotensin Receptor Blockers Failed - 04/18/2022  2:27 PM      Failed - Cr in normal range and within 180 days    Creatinine  Date Value Ref Range Status  04/06/2014 1.37 (H) 0.60 - 1.30 mg/dL Final   Creatinine, Ser  Date Value Ref Range Status  02/20/2022 1.47 (H) 0.76 - 1.27 mg/dL Final         Passed - K in normal range and within 180 days    Potassium  Date Value Ref Range Status  02/20/2022 4.5 3.5 - 5.2 mmol/L Final  04/06/2014 3.8 3.5 - 5.1 mmol/L Final         Passed - Patient is not pregnant      Passed - Last BP in normal range    BP Readings from Last 1 Encounters:  02/20/22 113/64         Passed - Valid encounter within last 6 months    Recent Outpatient Visits           1 month ago Stage 3a chronic kidney disease (HCC)   [facility] Cannady, Jolene T, NP   7 months ago Stage 3a chronic kidney disease (HCC)   [facility] Cannady, Jolene T, NP   1 year ago Aortic atherosclerosis (HCC)   [facility] Cannady, Jolene T, NP   1 year ago Stage 3a chronic kidney disease (HCC)   [facility] Cannady, Jolene T, NP   1 year ago Stage 3a chronic kidney disease (HCC)   [facility] Cannady, Dorie Rank, NP       Future Appointments             In 4 months Cannady, Dorie Rank, NP [facility]

## 2022-04-23 ENCOUNTER — Telehealth: Payer: Self-pay | Admitting: Nurse Practitioner

## 2022-04-23 MED ORDER — LOSARTAN POTASSIUM 25 MG PO TABS: 25.0000 mg | ORAL_TABLET | Freq: Every day | ORAL | 4 refills | Status: DC

## 2022-04-23 NOTE — Telephone Encounter (Signed)
Nicholas Shah  states that he needs a refill on his medication (losartan (COZAAR) 25 MG tablet [[id number]]  he was last seen on 5/4.  Please advise.

## 2022-05-19 ENCOUNTER — Other Ambulatory Visit: Payer: Self-pay

## 2022-05-19 DIAGNOSIS — Z23 Encounter for immunization: Secondary | ICD-10-CM

## 2022-05-23 ENCOUNTER — Ambulatory Visit (INDEPENDENT_AMBULATORY_CARE_PROVIDER_SITE_OTHER): Payer: Managed Care, Other (non HMO)

## 2022-05-23 DIAGNOSIS — Z23 Encounter for immunization: Secondary | ICD-10-CM

## 2022-08-24 NOTE — Patient Instructions (Signed)

## 2022-08-27 ENCOUNTER — Encounter: Payer: Self-pay | Admitting: Nurse Practitioner

## 2022-08-27 ENCOUNTER — Ambulatory Visit: Payer: Managed Care, Other (non HMO) | Admitting: Nurse Practitioner

## 2022-08-27 VITALS — BP 138/84 | HR 75 | Temp 98.1°F | Wt 169.0 lb

## 2022-08-27 DIAGNOSIS — I700 Atherosclerosis of aorta: Secondary | ICD-10-CM | POA: Diagnosis not present

## 2022-08-27 DIAGNOSIS — E782 Mixed hyperlipidemia: Secondary | ICD-10-CM | POA: Diagnosis not present

## 2022-08-27 DIAGNOSIS — I10 Essential (primary) hypertension: Secondary | ICD-10-CM | POA: Diagnosis not present

## 2022-08-27 DIAGNOSIS — Z1211 Encounter for screening for malignant neoplasm of colon: Secondary | ICD-10-CM

## 2022-08-27 DIAGNOSIS — Z0000 Encounter for general adult medical examination without abnormal findings: Secondary | ICD-10-CM | POA: Diagnosis not present

## 2022-08-27 DIAGNOSIS — R972 Elevated prostate specific antigen [PSA]: Secondary | ICD-10-CM

## 2022-08-27 DIAGNOSIS — Z23 Encounter for immunization: Secondary | ICD-10-CM

## 2022-08-27 DIAGNOSIS — N1831 Chronic kidney disease, stage 3a: Secondary | ICD-10-CM

## 2022-08-27 LAB — MICROALBUMIN, URINE WAIVED
Creatinine, Urine Waived: 200 mg/dL (ref 10–300)
Microalb, Ur Waived: 80 mg/L — ABNORMAL HIGH (ref 0–19)
Microalb/Creat Ratio: <30 mg/g (ref <30)

## 2022-08-27 MED ORDER — FLUTICASONE PROPIONATE 50 MCG/ACT NA SUSP: 2.0000 | Freq: Every day | NASAL | 6 refills | Status: DC

## 2022-08-27 MED ORDER — LORATADINE 10 MG PO TABS: 10.0000 mg | ORAL_TABLET | Freq: Every day | ORAL | 4 refills | Status: DC

## 2022-08-27 MED ORDER — ATORVASTATIN CALCIUM 10 MG PO TABS: 10.0000 mg | ORAL_TABLET | Freq: Every day | ORAL | 4 refills | Status: DC

## 2022-08-27 MED ORDER — LOSARTAN POTASSIUM 25 MG PO TABS: 25.0000 mg | ORAL_TABLET | Freq: Every day | ORAL | 4 refills | Status: DC

## 2022-08-27 NOTE — Progress Notes (Signed)
BP 138/84   Pulse 75   Temp 98.1 F (36.7 C) (Oral)   Wt 169 lb (76.7 kg)   SpO2 96%   BMI 25.70 kg/m    Subjective:    Patient ID: Nicholas Shah, male    DOB: 04-05-1951, 71 y.o.   MRN: [id number]  HPI: Nicholas Shah is a 71 y.o. male presenting on 08/27/2022 for comprehensive medical examination. Current medical complaints include:none  He currently lives with: significant other Interim Problems from his last visit: no  ELEVATED PSA: Continues to be followed by urology for elevated PSA in past.  Last seen by Dr. Eliberto Ivory with a biopsy on 07/19/20 -- was negative.  Has not returned since this time.   Nocturia: 1/night Urinary frequency:yes Incomplete voiding: sometimes Urgency: yes Weak urinary stream: no Straining to start stream: no Dysuria: no Onset: gradual Severity: mild IPSS Questionnaire (AUA-7):  AUA 4 Over the past month.   1)  How often have you had a sensation of not emptying your bladder completely after you finish urinating?  1 - Less than 1 time in 5  2)  How often have you had to urinate again less than two hours after you finished urinating? 2 - Less than half the time  3)  How often have you found you stopped and started again several times when you urinated?  0 - Not at all  4) How difficult have you found it to postpone urination?  0 - Not at all  5) How often have you had a weak urinary stream?  0 - Not at all  6) How often have you had to push or strain to begin urination?  0 - Not at all  7) How many times did you most typically get up to urinate from the time you went to bed until the time you got up in the morning?  1 - 1 time  Total score:  0-7 mildly symptomatic   8-19 moderately symptomatic   20-35 severely symptomatic     HYPERTENSION / HYPERLIPIDEMIA Currently taking Losartan 25 MG and Atorvastatin 10 MG. Aortic atherosclerosis noted on imaging 02/12/21. Satisfied with current treatment? yes Duration of hypertension: chronic BP  monitoring frequency: not checking BP range:  BP medication side effects: no Duration of hyperlipidemia: chronic Aspirin: no Recent stressors: no Recurrent headaches: no Visual changes: no Palpitations: no Dyspnea: no Chest pain: no Lower extremity edema: no Dizzy/lightheaded: no  The 10-year ASCVD risk score (Arnett DK, et al., 2019) is: 18.4%   Values used to calculate the score:     Age: 85 years     Sex: Male     Is Non-Hispanic African American: Yes     Diabetic: No     Tobacco smoker: No     Systolic Blood Pressure: 726 mmHg     Is BP treated: Yes     HDL Cholesterol: 66 mg/dL     Total Cholesterol: 146 mg/dL  CHRONIC KIDNEY DISEASE Noted past labs and monitoring. CKD status: stable Medications renally dose: yes Previous renal evaluation: no Pneumovax:  Up to Date Influenza Vaccine:  Up to Date    Functional Status Survey: Is the patient deaf or have difficulty hearing?: No Does the patient have difficulty seeing, even when wearing glasses/contacts?: No Does the patient have difficulty concentrating, remembering, or making decisions?: No Does the patient have difficulty walking or climbing stairs?: No Does the patient have difficulty dressing or bathing?: No Does the patient have difficulty  doing errands alone such as visiting a doctor's office or shopping?: No  FALL RISK:    08/27/2022    9:07 AM 08/27/2022    8:46 AM 02/20/2022    8:58 AM 08/23/2021    9:10 AM 11/23/2020    8:40 AM  [place] in the past year? 0 0 0 0 0  Number falls in past yr: 0 0 0 0   Injury with Fall? 0 0 0 0   Risk for fall due to : No Fall Risks No Fall Risks No Fall Risks No Fall Risks   Follow up Falls prevention discussed Falls evaluation completed Falls evaluation completed Falls evaluation completed     Depression Screen    08/27/2022    8:47 AM 02/20/2022    8:58 AM 08/23/2021    9:10 AM 11/23/2020    8:40 AM 10/25/2020    2:55 PM  Depression screen PHQ 2/9  Decreased  Interest 1 1 0 0 0  Down, Depressed, Hopeless 0 1 0 0 0  PHQ - 2 Score 1 2 0 0 0  Altered sleeping 1 0  0   Tired, decreased energy 1 1  0   Change in appetite 0 1  0   Feeling bad or failure about yourself  0 0  0   Trouble concentrating 0 0  0   Moving slowly or fidgety/restless 0 0  0   Suicidal thoughts 0 0  0   PHQ-9 Score 3 4  0   Difficult doing work/chores Not difficult at all Somewhat difficult       Past Medical History:  Past Medical History:  Diagnosis Date   Allergy    Arthritis     Surgical History:  Past Surgical History:  Procedure Laterality Date   APPENDECTOMY     COLONOSCOPY     CYSTOSCOPY WITH INSERTION OF UROLIFT N/A 05/11/2018   Procedure: CYSTOSCOPY WITH INSERTION OF UROLIFT;  Surgeon: Royston Cowper, MD;  Location: [facility];  Service: Urology;  Laterality: N/A;   FACIAL RECONSTRUCTION SURGERY  1974   WIRES   KNEE ARTHROSCOPY Right 04/17/2016   Procedure: ARTHROSCOPY KNEE, MEDIAL AND LATERAL PARTIAL MENISECTOMIES;  Surgeon: Thornton Park, MD;  Location: [facility];  Service: Orthopedics;  Laterality: Right;   MINOR CARPAL TUNNEL     PROSTATE BIOPSY N/A 07/19/2020   Procedure: PROSTATE BIOPSY Pearson Forster;  Surgeon: Royston Cowper, MD;  Location: [facility];  Service: Urology;  Laterality: N/A;   TRIGGER FINGER RELEASE      Medications:  Current Outpatient Medications on File Prior to Visit  Medication Sig   Nutritional Supplements (PROSTATE 2.4 PO) Take 2 tablets by mouth daily.    No current facility-administered medications on file prior to visit.    Allergies:  No Known Allergies  Social History:  Social History   Socioeconomic History   Marital status: Married    Spouse name: Not on file   Number of children: Not on file   Years of education: Not on file   Highest education level: Not on file  Occupational History   Not on file  Tobacco Use   Smoking status: Never   Smokeless tobacco: Never  Vaping Use   Vaping Use: Never used   Substance and Sexual Activity   Alcohol use: No    Alcohol/week: 0.0 standard drinks of alcohol   Drug use: No   Sexual activity: Yes  Other Topics Concern   Not  on file  Social History Narrative   Not on file   Social Determinants of Health   Financial Resource Strain: Low Risk  (08/23/2021)   Overall Financial Resource Strain (CARDIA)    Difficulty of Paying Living Expenses: Not hard at all  Food Insecurity: No Food Insecurity (08/23/2021)   Hunger Vital Sign    Worried About Running Out of Food in the Last Year: Never true    Ran Out of Food in the Last Year: Never true  Transportation Needs: No Transportation Needs (08/23/2021)   PRAPARE - Hydrologist (Medical): No    Lack of Transportation (Non-Medical): No  Physical Activity: Sufficiently Active (08/23/2021)   Exercise Vital Sign    Days of Exercise per Week: 4 days    Minutes of Exercise per Session: 40 min  Stress: No Stress Concern Present (08/23/2021)   [place]    Feeling of Stress : Only a little  Social Connections: Moderately Isolated (08/23/2021)   Social Connection and Isolation Panel [NHANES]    Frequency of Communication with Friends and Family: More than three times a week    Frequency of Social Gatherings with Friends and Family: More than three times a week    Attends Religious Services: Never    Marine scientist or Organizations: No    Attends Archivist Meetings: Never    Marital Status: Married  Human resources officer Violence: Not At Risk (08/23/2021)   Humiliation, Afraid, Rape, and Kick questionnaire    Fear of Current or Ex-Partner: No    Emotionally Abused: No    Physically Abused: No    Sexually Abused: No   Social History   Tobacco Use  Smoking Status Never  Smokeless Tobacco Never   Social History   Substance and Sexual Activity  Alcohol Use No   Alcohol/week: 0.0 standard drinks  of alcohol    Family History:  Family History  Problem Relation Age of Onset   CAD Father    Lung cancer Father    Heart failure Father    Diabetes Maternal Grandfather    Diabetes Paternal Grandmother    CAD Paternal Grandfather    Heart disease Paternal Grandfather     Past medical history, surgical history, medications, allergies, family history and social history reviewed with patient today and changes made to appropriate areas of the chart.   ROS All other ROS negative except what is listed above and in the HPI.      Objective:    BP 138/84   Pulse 75   Temp 98.1 F (36.7 C) (Oral)   Wt 169 lb (76.7 kg)   SpO2 96%   BMI 25.70 kg/m   Wt Readings from Last 3 Encounters:  08/27/22 169 lb (76.7 kg)  02/20/22 170 lb 3.2 oz (77.2 kg)  08/23/21 164 lb (74.4 kg)    Physical Exam Vitals and nursing note reviewed.  Constitutional:      General: He is awake. He is not in acute distress.    Appearance: He is well-developed and well-groomed. He is not ill-appearing or toxic-appearing.  HENT:     Head: Normocephalic and atraumatic.     Right Ear: Hearing, tympanic membrane, ear canal and external ear normal. No drainage.     Left Ear: Hearing, tympanic membrane, ear canal and external ear normal. No drainage.     Nose: Nose normal.     Mouth/Throat:  Pharynx: Uvula midline.  Eyes:     General: Lids are normal.        Right eye: No discharge.        Left eye: No discharge.     Extraocular Movements: Extraocular movements intact.     Conjunctiva/sclera: Conjunctivae normal.     Pupils: Pupils are equal, round, and reactive to light.     Visual Fields: Right eye visual fields normal and left eye visual fields normal.  Neck:     Thyroid: No thyromegaly.     Vascular: No carotid bruit or JVD.     Trachea: Trachea normal.  Cardiovascular:     Rate and Rhythm: Normal rate and regular rhythm.     Heart sounds: Normal heart sounds, S1 normal and S2 normal. No murmur  heard.    No gallop.  Pulmonary:     Effort: Pulmonary effort is normal. No accessory muscle usage or respiratory distress.     Breath sounds: Normal breath sounds.  Abdominal:     General: Bowel sounds are normal.     Palpations: Abdomen is soft. There is no hepatomegaly or splenomegaly.     Tenderness: There is no abdominal tenderness.  Musculoskeletal:        General: Normal range of motion.     Cervical back: Normal range of motion and neck supple.     Right lower leg: No edema.     Left lower leg: No edema.  Lymphadenopathy:     Head:     Right side of head: No submental, submandibular, tonsillar, preauricular or posterior auricular adenopathy.     Left side of head: No submental, submandibular, tonsillar, preauricular or posterior auricular adenopathy.     Cervical: No cervical adenopathy.  Skin:    General: Skin is warm and dry.     Capillary Refill: Capillary refill takes less than 2 seconds.     Findings: No rash.  Neurological:     Mental Status: He is alert and oriented to person, place, and time.     Gait: Gait is intact.     Deep Tendon Reflexes: Reflexes are normal and symmetric.     Reflex Scores:      Brachioradialis reflexes are 2+ on the right side and 2+ on the left side.      Patellar reflexes are 2+ on the right side and 2+ on the left side. Psychiatric:        Attention and Perception: Attention normal.        Mood and Affect: Mood normal.        Speech: Speech normal.        Behavior: Behavior normal. Behavior is cooperative.        Thought Content: Thought content normal.        Cognition and Memory: Cognition normal.    Results for orders placed or performed in visit on 12/87/86  Basic metabolic panel  Result Value Ref Range   Glucose 81 70 - 99 mg/dL   BUN 20 8 - 27 mg/dL   Creatinine, Ser 1.47 (H) 0.76 - 1.27 mg/dL   eGFR 51 (L) >59 mL/min/1.73   BUN/Creatinine Ratio 14 10 - 24   Sodium 143 134 - 144 mmol/L   Potassium 4.5 3.5 - 5.2 mmol/L    Chloride 105 96 - 106 mmol/L   CO2 25 20 - 29 mmol/L   Calcium 9.9 8.6 - 10.2 mg/dL  Lipid Panel w/o Chol/HDL Ratio  Result Value Ref Range  Cholesterol, Total 146 100 - 199 mg/dL   Triglycerides 77 0 - 149 mg/dL   HDL 66 >39 mg/dL   VLDL Cholesterol Cal 15 5 - 40 mg/dL   LDL Chol Calc (NIH) 65 0 - 99 mg/dL      Assessment & Plan:   Problem List Items Addressed This Visit       Cardiovascular and Mediastinum   Aortic atherosclerosis ([place])    Noted on CT imaging 02/12/21 -- educated patient on finding.  Recommend continue statin daily for prevention + daily Baby ASA 81 MG.        Relevant Medications   atorvastatin (LIPITOR) 10 MG tablet   losartan (COZAAR) 25 MG tablet   Essential hypertension    Chronic, ongoing.  BP at goal in office for age.  Recommend he monitor BP at least a few mornings a week at home and document.  DASH diet at home.  Continue current medication regimen and adjust as needed.  Labs today: CMP, CBC, TSH, urine ALB.  Refills sent.  Return in 6 months.        Relevant Medications   atorvastatin (LIPITOR) 10 MG tablet   losartan (COZAAR) 25 MG tablet   Other Relevant Orders   TSH     Genitourinary   Stage 3a chronic kidney disease ([place]) - Primary    Chronic, improved with Losartan on board.  Continue ARB for kidney function.  Recommend increase water intake at home + avoid Ibuprofen products.  CMP and urine ALB today.  Refer to nephrology if worsening disease presents.      Relevant Orders   CBC with Differential/Platelet   Comprehensive metabolic panel   Microalbumin, Urine Waived   PTH, intact and calcium     Other   Elevated PSA    Continue collaboration with urology, Dr. Yves Dill, will place new referral as was lost to follow-up with them.  PSA today.      Relevant Orders   PSA   Ambulatory referral to Urology   Hyperlipidemia    Chronic, ongoing.  Has significant family history with father and grandfather having strokes.  Continue daily  statin at this time and adjust as needed.  Lipid panel today. Goal LDL <70 for this patient.  Return in 6 months for this.      Relevant Medications   atorvastatin (LIPITOR) 10 MG tablet   losartan (COZAAR) 25 MG tablet   Other Relevant Orders   Comprehensive metabolic panel   Lipid Panel w/o Chol/HDL Ratio   Other Visit Diagnoses     Pneumococcal vaccination given       PCV20 today.   Relevant Orders   Pneumococcal conjugate vaccine 20-valent (Prevnar 20) (Completed)   Colon cancer screening       GI referral placed   Relevant Orders   Ambulatory referral to Gastroenterology   Encounter for annual physical exam       Annual physical today with labs and health maintenance reviewed, discussed with patient.        Discussed aspirin prophylaxis for myocardial infarction prevention and decision was it was not indicated  LABORATORY TESTING:  Health maintenance labs ordered today as discussed above.   The natural history of prostate cancer and ongoing controversy regarding screening and potential treatment outcomes of prostate cancer has been discussed with the patient. The meaning of a false positive PSA and a false negative PSA has been discussed. He indicates understanding of the limitations of this screening test and wishes  to proceed with screening PSA testing -- done with urology.   IMMUNIZATIONS:   - Tdap: Tetanus vaccination status reviewed: last tetanus booster within 10 years. - Influenza: Up to date - Pneumovax: PCV20 provided today - Prevnar: Up To Date - Zostavax vaccine: Up To Date - Covid vaccines - up to date  SCREENING: - Colonoscopy: Ordered referral today, discussed with patient Discussed with patient purpose of the colonoscopy is to detect colon cancer at curable precancerous or early stages   - AAA Screening: Not applicable  -Hearing Test: Not applicable  -Spirometry: Not applicable   PATIENT COUNSELING:    Sexuality: Discussed sexually transmitted  diseases, partner selection, use of condoms, avoidance of unintended pregnancy  and contraceptive alternatives.   Advised to avoid cigarette smoking.  I discussed with the patient that most people either abstain from alcohol or drink within safe limits (<=14/week and <=4 drinks/occasion for males, <=7/weeks and <= 3 drinks/occasion for females) and that the risk for alcohol disorders and other health effects rises proportionally with the number of drinks per week and how often a drinker exceeds daily limits.  Discussed cessation/primary prevention of drug use and availability of treatment for abuse.   Diet: Encouraged to adjust caloric intake to maintain  or achieve ideal body weight, to reduce intake of dietary saturated fat and total fat, to limit sodium intake by avoiding high sodium foods and not adding table salt, and to maintain adequate dietary potassium and calcium preferably from fresh fruits, vegetables, and low-fat dairy products.    Stressed the importance of regular exercise  Injury prevention: Discussed safety belts, safety helmets, smoke detector, smoking near bedding or upholstery.   Dental health: Discussed importance of regular tooth brushing, flossing, and dental visits.   Follow up plan: NEXT PREVENTATIVE PHYSICAL DUE IN 1 YEAR. Return in about 6 months (around 02/25/2023) for HTN/HLD, PSA, CKD.

## 2022-08-27 NOTE — Assessment & Plan Note (Signed)
Chronic, ongoing.  Has significant family history with father and grandfather having strokes.  Continue daily statin at this time and adjust as needed.  Lipid panel today. Goal LDL <70 for this patient.  Return in 6 months for this.

## 2022-08-27 NOTE — Assessment & Plan Note (Signed)
Chronic, ongoing.  BP at goal in office for age.  Recommend he monitor BP at least a few mornings a week at home and document.  DASH diet at home.  Continue current medication regimen and adjust as needed.  Labs today: CMP, CBC, TSH, urine ALB.  Refills sent.  Return in 6 months.

## 2022-08-27 NOTE — Assessment & Plan Note (Signed)
Noted on CT imaging 02/12/21 -- educated patient on finding.  Recommend continue statin daily for prevention + daily Baby ASA 81 MG.

## 2022-08-27 NOTE — Assessment & Plan Note (Signed)
Continue collaboration with urology, Dr. Evelene Croon, will place new referral as was lost to follow-up with them.  PSA today.

## 2022-08-27 NOTE — Assessment & Plan Note (Signed)
Chronic, improved with Losartan on board.  Continue ARB for kidney function.  Recommend increase water intake at home + avoid Ibuprofen products.  CMP and urine ALB today.  Refer to nephrology if worsening disease presents.

## 2022-08-28 LAB — PTH, INTACT AND CALCIUM: PTH: 31 pg/mL (ref 15–65)

## 2022-08-28 LAB — TSH: TSH: 0.97 u[IU]/mL (ref 0.450–4.500)

## 2022-08-28 LAB — COMPREHENSIVE METABOLIC PANEL
ALT: 23 IU/L (ref 0–44)
AST: 20 IU/L (ref 0–40)
Albumin/Globulin Ratio: 2.5 — ABNORMAL HIGH (ref 1.2–2.2)
Albumin: 5 g/dL — ABNORMAL HIGH (ref 3.9–4.9)
Alkaline Phosphatase: 74 IU/L (ref 44–121)
BUN/Creatinine Ratio: 12 (ref 10–24)
BUN: 19 mg/dL (ref 8–27)
Bilirubin Total: 0.9 mg/dL (ref 0.0–1.2)
CO2: 23 mmol/L (ref 20–29)
Calcium: 9.6 mg/dL (ref 8.6–10.2)
Chloride: 104 mmol/L (ref 96–106)
Creatinine, Ser: 1.55 mg/dL — ABNORMAL HIGH (ref 0.76–1.27)
Globulin, Total: 2 g/dL (ref 1.5–4.5)
Glucose: 94 mg/dL (ref 70–99)
Potassium: 4.7 mmol/L (ref 3.5–5.2)
Sodium: 141 mmol/L (ref 134–144)
Total Protein: 7 g/dL (ref 6.0–8.5)
eGFR: 48 mL/min/{1.73_m2} — ABNORMAL LOW (ref >59)

## 2022-08-28 LAB — LIPID PANEL W/O CHOL/HDL RATIO
Cholesterol, Total: 162 mg/dL (ref 100–199)
HDL: 69 mg/dL (ref >39)
LDL Chol Calc (NIH): 76 mg/dL (ref 0–99)
Triglycerides: 90 mg/dL (ref 0–149)
VLDL Cholesterol Cal: 17 mg/dL (ref 5–40)

## 2022-08-28 LAB — CBC WITH DIFFERENTIAL/PLATELET
Basophils Absolute: 0 10*3/uL (ref 0.0–0.2)
Basos: 1 %
EOS (ABSOLUTE): 0.1 10*3/uL (ref 0.0–0.4)
Eos: 3 %
Hematocrit: 46.5 % (ref 37.5–51.0)
Hemoglobin: 15.4 g/dL (ref 13.0–17.7)
Immature Grans (Abs): 0 10*3/uL (ref 0.0–0.1)
Immature Granulocytes: 0 %
Lymphocytes Absolute: 1.7 10*3/uL (ref 0.7–3.1)
Lymphs: 43 %
MCH: 30.1 pg (ref 26.6–33.0)
MCHC: 33.1 g/dL (ref 31.5–35.7)
MCV: 91 fL (ref 79–97)
Monocytes Absolute: 0.3 10*3/uL (ref 0.1–0.9)
Monocytes: 8 %
Neutrophils Absolute: 1.7 10*3/uL (ref 1.4–7.0)
Neutrophils: 45 %
Platelets: 258 10*3/uL (ref 150–450)
RBC: 5.12 x10E6/uL (ref 4.14–5.80)
RDW: 13.1 % (ref 11.6–15.4)
WBC: 3.8 10*3/uL (ref 3.4–10.8)

## 2022-08-28 LAB — PSA: Prostate Specific Ag, Serum: 10.8 ng/mL — ABNORMAL HIGH (ref 0.0–4.0)

## 2022-08-28 NOTE — Progress Notes (Signed)
Good afternoon, please call Nicholas Shah, as he does not always check MyChart: - Kidney function continues to show baseline kidney disease stage 3a with no worsening, continue blood pressure medication.  Liver function normal. - Prostate lab has trended up since last check by me, it is now 10.8 -- please ensure you schedule with Dr. Evelene Shah ASAP when they call -- maintain follow-up visits with him. Are you scheduled yet? - Remainder of labs stable.  Continue current medications.  Waiting on one more labs to return and if it is abnormal I will let you know.  Any questions? Keep being stellar!!  Thank you for allowing me to participate in your care.  I appreciate you. Kindest regards, Nicholas Shah

## 2022-08-29 ENCOUNTER — Telehealth: Payer: Self-pay

## 2022-08-29 ENCOUNTER — Other Ambulatory Visit: Payer: Self-pay

## 2022-08-29 DIAGNOSIS — Z1211 Encounter for screening for malignant neoplasm of colon: Secondary | ICD-10-CM

## 2022-08-29 MED ORDER — NA SULFATE-K SULFATE-MG SULF 17.5-3.13-1.6 GM/177ML PO SOLN: 1.0000 | Freq: Once | ORAL | 0 refills | Status: AC

## 2022-08-29 NOTE — Telephone Encounter (Signed)
Gastroenterology Pre-Procedure Review  Request Date: 09/25/22 Requesting Physician: Dr. Tobi Bastos  PATIENT REVIEW QUESTIONS: The patient responded to the following health history questions as indicated:    1. Are you having any GI issues? no 2. Do you have a personal history of Polyps? no 3. Do you have a family history of Colon Cancer or Polyps? no 4. Diabetes Mellitus? no 5. Joint replacements in the past 12 months?no 6. Major health problems in the past 3 months?no 7. Any artificial heart valves, MVP, or defibrillator?no    MEDICATIONS & ALLERGIES:    Patient reports the following regarding taking any anticoagulation/antiplatelet therapy:   Plavix, Coumadin, Eliquis, Xarelto, Lovenox, Pradaxa, Brilinta, or Effient? no Aspirin? no  Patient confirms/reports the following medications:  Current Outpatient Medications  Medication Sig Dispense Refill   atorvastatin (LIPITOR) 10 MG tablet Take 1 tablet (10 mg total) by mouth daily. 90 tablet 4   fluticasone (FLONASE) 50 MCG/ACT nasal spray Place 2 sprays into both nostrils daily. 16 g 6   loratadine (CLARITIN) 10 MG tablet Take 1 tablet (10 mg total) by mouth daily. 90 tablet 4   losartan (COZAAR) 25 MG tablet Take 1 tablet (25 mg total) by mouth daily. For blood pressure. 90 tablet 4   Nutritional Supplements (PROSTATE 2.4 PO) Take 2 tablets by mouth daily.      No current facility-administered medications for this visit.    Patient confirms/reports the following allergies:  No Known Allergies  No orders of the defined types were placed in this encounter.   AUTHORIZATION INFORMATION Primary Insurance: 1D#: Group #:  Secondary Insurance: 1D#: Group #:  SCHEDULE INFORMATION: Date: 09/25/22 Time: Location: [facility]

## 2022-08-29 NOTE — Telephone Encounter (Signed)
Patient is currently at the gym.  Informed him he can call me back after he finishes his workout.  Thanks,  [place], [place]

## 2022-09-03 ENCOUNTER — Telehealth: Payer: Self-pay | Admitting: Nurse Practitioner

## 2022-09-03 NOTE — Telephone Encounter (Signed)
Lab result printed and faxed to [facility].

## 2022-09-03 NOTE — Telephone Encounter (Signed)
Copied from CRM [id number]. Topic: General - Other >> Sep 03, 2022  9:58 AM Pincus Sanes wrote: Reason for CRM: [facility] calling stating received all other info requested for pt but was not sent last PSA as requested. Please fax to [facility]/Att Joycelyn Das fax# [phone]

## 2022-09-10 ENCOUNTER — Other Ambulatory Visit: Payer: Self-pay | Admitting: Urology

## 2022-09-10 DIAGNOSIS — R972 Elevated prostate specific antigen [PSA]: Secondary | ICD-10-CM

## 2022-09-23 ENCOUNTER — Telehealth: Payer: Self-pay | Admitting: Gastroenterology

## 2022-09-23 NOTE — Telephone Encounter (Signed)
Pt called and cancelled procedure for 09/25/2022 and will call back and resched

## 2022-09-23 NOTE — Telephone Encounter (Signed)
Patients colonoscopy has been canceled with Trish in Endo.  Thanks, Kasidi Shanker, CMA 

## 2022-09-24 ENCOUNTER — Ambulatory Visit: Payer: Managed Care, Other (non HMO)

## 2022-09-25 ENCOUNTER — Ambulatory Visit
Admission: RE | Admit: 2022-09-25 | Payer: Managed Care, Other (non HMO) | Source: Home / Self Care | Admitting: Gastroenterology

## 2022-09-25 ENCOUNTER — Encounter: Admission: RE | Payer: Self-pay | Source: Home / Self Care

## 2022-09-25 SURGERY — COLONOSCOPY WITH PROPOFOL
Anesthesia: General

## 2022-10-29 DIAGNOSIS — H25013 Cortical age-related cataract, bilateral: Secondary | ICD-10-CM | POA: Diagnosis not present

## 2022-11-03 ENCOUNTER — Ambulatory Visit: Payer: Self-pay | Admitting: Urology

## 2022-11-04 ENCOUNTER — Ambulatory Visit: Payer: 59 | Admitting: Urology

## 2022-11-04 ENCOUNTER — Encounter: Payer: Self-pay | Admitting: Urology

## 2022-11-04 VITALS — BP 160/93 | HR 116 | Ht 67.0 in | Wt 173.2 lb

## 2022-11-04 DIAGNOSIS — N401 Enlarged prostate with lower urinary tract symptoms: Secondary | ICD-10-CM

## 2022-11-04 DIAGNOSIS — N529 Male erectile dysfunction, unspecified: Secondary | ICD-10-CM

## 2022-11-04 DIAGNOSIS — N138 Other obstructive and reflux uropathy: Secondary | ICD-10-CM | POA: Diagnosis not present

## 2022-11-04 DIAGNOSIS — R972 Elevated prostate specific antigen [PSA]: Secondary | ICD-10-CM | POA: Diagnosis not present

## 2022-11-04 NOTE — Patient Instructions (Signed)

## 2022-11-04 NOTE — Progress Notes (Signed)
11/04/22 4:01 PM   Nicholas Shah 04-Feb-1951 [id number]  CC: Elevated PSA, BPH, ED  HPI: 72 year old male transferring his care from Dr. Eliberto Ivory for the above issues.  He reportedly underwent a UroLift in 2019 for obstructive urinary symptoms and has had good results on that.  He has a long history of elevated PSA ranging from 4.8 in 2018 up to 10.8 most recently in November 2023.  PSA has fluctuated primarily between 7.4 and 8.2 over the last 3 to 4 years.  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  He underwent an MRI fusion biopsy with Dr. Eliberto Ivory at that time which showed inflammation but no evidence of malignancy.  PSA was 7.4 at that time.  Finally, he had a prostate IntelliScore suggesting a <10% chance of clinically significant prostate cancer.  Denies any significant urinary symptoms at this time, takes a prostate supplement but he would like to discontinue this.  He also reports ED and was prescribed sildenafil, but he currently does not have a partner and has not tried the sildenafil.   PMH: Past Medical History:  Diagnosis Date   Allergy    Arthritis     Surgical History: Past Surgical History:  Procedure Laterality Date   APPENDECTOMY     COLONOSCOPY     CYSTOSCOPY WITH INSERTION OF UROLIFT N/A 05/11/2018   Procedure: CYSTOSCOPY WITH INSERTION OF UROLIFT;  Surgeon: Royston Cowper, MD;  Location: [facility];  Service: Urology;  Laterality: N/A;   FACIAL RECONSTRUCTION SURGERY  1974   WIRES   KNEE ARTHROSCOPY Right 04/17/2016   Procedure: ARTHROSCOPY KNEE, MEDIAL AND LATERAL PARTIAL MENISECTOMIES;  Surgeon: Thornton Park, MD;  Location: [facility];  Service: Orthopedics;  Laterality: Right;   MINOR CARPAL TUNNEL     PROSTATE BIOPSY N/A 07/19/2020   Procedure: PROSTATE BIOPSY Pearson Forster;  Surgeon: Royston Cowper, MD;  Location: [facility];  Service: Urology;   Laterality: N/A;   TRIGGER FINGER RELEASE       Family History: Family History  Problem Relation Age of Onset   CAD Father    Lung cancer Father    Heart failure Father    Diabetes Maternal Grandfather    Diabetes Paternal Grandmother    CAD Paternal Grandfather    Heart disease Paternal Grandfather     Social History:  reports that he has never smoked. He has never used smokeless tobacco. He reports that he does not drink alcohol and does not use drugs.  Physical Exam: BP (!) 160/93 (BP Location: Left Arm, Patient Position: Sitting, Cuff Size: Normal)   Pulse (!) 116   Ht 5\' 7"  (1.702 m)   Wt 173 lb 3.2 oz (78.6 kg)   BMI 27.13 kg/m    Constitutional:  Alert and oriented, No acute distress. Cardiovascular: No clubbing, cyanosis, or edema. Respiratory: Normal respiratory effort, no increased work of breathing. GI: Abdomen is soft, nontender, nondistended, no abdominal masses   Laboratory Data: Reviewed, see HPI  Pertinent Imaging: I have personally viewed and interpreted the prostate MRI, see above for results.  Assessment & Plan:   72 year old male prior patient of Dr. Eliberto Ivory who underwent a UroLift in 2019, and has had a steadily rising PSA over the last 3 to 4 years, most recently 10.8.  He had a MRI showing a 65 g prostate in 2021, with a possible  PI-RADS 4 lesion, an MRI fusion biopsy at that time was benign aside from some inflammation.  We reviewed the implications of an elevated PSA and the uncertainty surrounding it. In general, a man's PSA increases with age and is produced by both normal and cancerous prostate tissue. The differential diagnosis for elevated PSA includes BPH, prostate cancer, infection, recent intercourse/ejaculation, recent urethroscopic manipulation (foley placement/cystoscopy) or trauma, and prostatitis.   Management of an elevated PSA can include observation or prostate biopsy and we discussed this in detail. Our goal is to detect clinically  significant prostate cancers, and manage with either active surveillance, surgery, or radiation for localized disease. Risks of prostate biopsy include bleeding, infection (including life threatening sepsis), pain, and lower urinary symptoms. Hematuria, hematospermia, and blood in the stool are all common after biopsy and can persist up to 4 weeks.   We discussed options including a repeat PSA with reflex to free in 6 months, prostate MRI, or transrectal ultrasound-guided prostate biopsy.  Risk and benefits discussed extensively, and using shared decision making he opted for repeat PSA in 6 months.   Nicholas Madrid, MD 11/04/2022  [facility] [address] [place], [place] [address] [phone]

## 2022-12-13 NOTE — Patient Instructions (Incomplete)
Finger Sprain, Adult A finger sprain is a tear or stretch in a ligament in a finger. Ligaments are tissues that connect bones to each other. What are the causes? Finger sprains happen when something makes the bones in the hand move in an abnormal way. They are often caused by a fall or an accident. What increases the risk? Playing sports where it is easy to fall, such as skiing. Playing sports that involve catching an object, such as basketball. Not being strong or flexible. What are the signs or symptoms? Pain or tenderness in the finger. Swelling in the finger. A bluish look to the finger. Getting bruises. Trouble bending the finger. How is this treated? Treatment depends on how bad the sprain is. It may involve: Preventing the finger from moving. Your finger may be wrapped in a bandage (dressing) or splint. Your finger also may be taped to the fingers next to it (buddy taping). Medicines for pain. Exercises to strengthen the finger. Surgery to reconnect the ligament to a bone. This may be done if the ligament was torn all the way. Follow these instructions at home: If you have a splint that can be taken off:  Wear the splint as told by your doctor. Take it off only as told by your doctor. Check the skin around the splint every day. Tell your doctor if you see problems. Loosen the splint if your fingers: Tingle. Become numb. Turn cold and blue. Keep the splint clean. If the splint is not waterproof: Do not let it get wet. Cover it with a watertight covering when you take a bath or shower. Managing pain, stiffness, and swelling If told, put ice on the injured area. To do this: If you have a removable splint, take it off as told by your doctor. Put ice in a plastic bag. Place a towel between your skin and the bag. Leave the ice on for 20 minutes, 2-3 times a day. Take off the ice if your skin turns bright red. This is very important. If you cannot feel pain, heat, or cold, you  have a greater risk of damage to the area. Move your fingers often. Raise the injured area above the level of your heart while you are sitting or lying down. Medicines Take over-the-counter and prescription medicines only as told by your doctor. Ask your doctor if you should avoid driving or using machines while you are taking your medicine. General instructions Keep any bandages dry until your doctor says they can be taken off. If your fingers are buddy taped, replace your buddy taping as told by your doctor. Do exercises as told by your doctor or physical therapist. Do not wear rings on your injured finger. Keep all follow-up visits. Contact a health care provider if: Your pain is not helped by medicine. Your bruising or swelling gets worse. Your splint is damaged. You have a fever. Get help right away if: You lose feeling in your finger. Your finger turns blue. Your finger feels colder than normal when you touch it. Summary A finger sprain is a tear or stretch in a ligament in your finger. Ligaments are tissues that connect bones to each other. If you have a splint, loosen the splint if your fingers tingle, become numb, or turn cold and blue. Move your fingers often. If told, put ice on the injured area. This information is not intended to replace advice given to you by your health care provider. Make sure you discuss any questions you have with  your health care provider. Document Revised: 08/29/2020 Document Reviewed: 08/29/2020 Elsevier Patient Education  [place].

## 2022-12-16 ENCOUNTER — Ambulatory Visit (INDEPENDENT_AMBULATORY_CARE_PROVIDER_SITE_OTHER): Payer: Medicare HMO | Admitting: Nurse Practitioner

## 2022-12-16 ENCOUNTER — Encounter: Payer: Self-pay | Admitting: Nurse Practitioner

## 2022-12-16 VITALS — BP 138/72 | HR 84 | Temp 98.1°F | Ht 67.99 in | Wt 172.8 lb

## 2022-12-16 DIAGNOSIS — M65332 Trigger finger, left middle finger: Secondary | ICD-10-CM

## 2022-12-16 NOTE — Assessment & Plan Note (Signed)
Acute for 1 1/2 months, history of same to right hand.  Referral to [facility] placed and provided him with number to call.  Recommend at this time splinting finger as needed.  Tylenol for pain, avoid NSAID due to HTN and CKD.

## 2022-12-16 NOTE — Progress Notes (Signed)
BP 138/72 (BP Location: Left Arm, Patient Position: Sitting, Cuff Size: Normal)   Pulse 84   Temp 98.1 F (36.7 C) (Oral)   Ht 5' 7.99" (1.727 m)   Wt 172 lb 12.8 oz (78.4 kg)   SpO2 97%   BMI 26.28 kg/m    Subjective:    Patient ID: Nicholas Shah, male    DOB: 28-Sep-1951, 72 y.o.   MRN: [id number]  HPI: Nicholas Shah is a 72 y.o. male  Chief Complaint  Patient presents with   Hand Pain    Left 3 finger pain started about a month ago, wants referral to someone to take care of it   FINGER PAIN Left middle finger pain for past 1 1/2 months.  History of trigger finger to right hand, ring finger, in past and this pain is similar to that -- had to have surgery to that finger.  Left middle finger is currently getting stuck.   Duration: months Involved hand: left Mechanism of injury: unknown Location: diffuse Onset: gradual Severity: 7/10 when it is stuck, when grabs to pick up items Quality: sharp, aching, shooting, and throbbing Frequency: intermittent Radiation: no Aggravating factors: movement and grabbing items Alleviating factors: nothing Treatments attempted: nothing Relief with NSAIDs?: No NSAIDs Taken Weakness: no Numbness: no Redness: no Swelling:no Bruising: no Fevers: no   Relevant past medical, surgical, family and social history reviewed and updated as indicated. Interim medical history since our last visit reviewed. Allergies and medications reviewed and updated.  Review of Systems  Constitutional:  Negative for activity change, diaphoresis, fatigue and fever.  Respiratory:  Negative for cough, chest tightness, shortness of breath and wheezing.   Cardiovascular:  Negative for chest pain, palpitations and leg swelling.  Gastrointestinal: Negative.   Musculoskeletal:  Positive for arthralgias.  Neurological: Negative.   Psychiatric/Behavioral: Negative.      Per HPI unless specifically indicated above     Objective:    BP 138/72 (BP  Location: Left Arm, Patient Position: Sitting, Cuff Size: Normal)   Pulse 84   Temp 98.1 F (36.7 C) (Oral)   Ht 5' 7.99" (1.727 m)   Wt 172 lb 12.8 oz (78.4 kg)   SpO2 97%   BMI 26.28 kg/m   Wt Readings from Last 3 Encounters:  12/16/22 172 lb 12.8 oz (78.4 kg)  11/04/22 173 lb 3.2 oz (78.6 kg)  08/27/22 169 lb (76.7 kg)    Physical Exam Vitals and nursing note reviewed.  Constitutional:      General: He is awake. He is not in acute distress.    Appearance: He is well-developed and well-groomed. He is not ill-appearing.  HENT:     Head: Normocephalic and atraumatic.     Right Ear: Hearing normal. No drainage.     Left Ear: Hearing normal. No drainage.  Eyes:     General: Lids are normal.        Right eye: No discharge.        Left eye: No discharge.     Conjunctiva/sclera: Conjunctivae normal.     Pupils: Pupils are equal, round, and reactive to light.  Neck:     Thyroid: No thyromegaly.     Vascular: No carotid bruit.     Trachea: Trachea normal.  Cardiovascular:     Rate and Rhythm: Normal rate and regular rhythm.     Heart sounds: Normal heart sounds, S1 normal and S2 normal. No murmur heard.    No gallop.  Pulmonary:  Effort: Pulmonary effort is normal. No accessory muscle usage or respiratory distress.     Breath sounds: Normal breath sounds.  Abdominal:     General: Bowel sounds are normal.     Palpations: Abdomen is soft.  Musculoskeletal:     Right hand: Normal.     Left hand: Tenderness present. No swelling or bony tenderness. Decreased range of motion. Decreased strength of thumb/finger opposition. Normal sensation. Normal pulse.     Cervical back: Normal range of motion and neck supple.     Right lower leg: No edema.     Left lower leg: No edema.  Skin:    General: Skin is warm and dry.     Capillary Refill: Capillary refill takes less than 2 seconds.     Findings: No rash.  Neurological:     Mental Status: He is alert and oriented to person,  place, and time.  Psychiatric:        Attention and Perception: Attention normal.        Mood and Affect: Mood normal.        Speech: Speech normal.        Behavior: Behavior normal. Behavior is cooperative.        Thought Content: Thought content normal.     Results for orders placed or performed in visit on 08/27/22  CBC with Differential/Platelet  Result Value Ref Range   WBC 3.8 3.4 - 10.8 x10E3/uL   RBC 5.12 4.14 - 5.80 x10E6/uL   Hemoglobin 15.4 13.0 - 17.7 g/dL   Hematocrit 46.5 37.5 - 51.0 %   MCV 91 79 - 97 fL   MCH 30.1 26.6 - 33.0 pg   MCHC 33.1 31.5 - 35.7 g/dL   RDW 13.1 11.6 - 15.4 %   Platelets 258 150 - 450 x10E3/uL   Neutrophils 45 Not Estab. %   Lymphs 43 Not Estab. %   Monocytes 8 Not Estab. %   Eos 3 Not Estab. %   Basos 1 Not Estab. %   Neutrophils Absolute 1.7 1.4 - 7.0 x10E3/uL   Lymphocytes Absolute 1.7 0.7 - 3.1 x10E3/uL   Monocytes Absolute 0.3 0.1 - 0.9 x10E3/uL   EOS (ABSOLUTE) 0.1 0.0 - 0.4 x10E3/uL   Basophils Absolute 0.0 0.0 - 0.2 x10E3/uL   Immature Granulocytes 0 Not Estab. %   Immature Grans (Abs) 0.0 0.0 - 0.1 x10E3/uL  Comprehensive metabolic panel  Result Value Ref Range   Glucose 94 70 - 99 mg/dL   BUN 19 8 - 27 mg/dL   Creatinine, Ser 1.55 (H) 0.76 - 1.27 mg/dL   eGFR 48 (L) >59 mL/min/1.73   BUN/Creatinine Ratio 12 10 - 24   Sodium 141 134 - 144 mmol/L   Potassium 4.7 3.5 - 5.2 mmol/L   Chloride 104 96 - 106 mmol/L   CO2 23 20 - 29 mmol/L   Calcium 9.6 8.6 - 10.2 mg/dL   Total Protein 7.0 6.0 - 8.5 g/dL   Albumin 5.0 (H) 3.9 - 4.9 g/dL   Globulin, Total 2.0 1.5 - 4.5 g/dL   Albumin/Globulin Ratio 2.5 (H) 1.2 - 2.2   Bilirubin Total 0.9 0.0 - 1.2 mg/dL   Alkaline Phosphatase 74 44 - 121 IU/L   AST 20 0 - 40 IU/L   ALT 23 0 - 44 IU/L  Lipid Panel w/o Chol/HDL Ratio  Result Value Ref Range   Cholesterol, Total 162 100 - 199 mg/dL   Triglycerides 90 0 - 149 mg/dL  HDL 69 >39 mg/dL   VLDL Cholesterol Cal 17 5 - 40 mg/dL    LDL Chol Calc (NIH) 76 0 - 99 mg/dL  TSH  Result Value Ref Range   TSH 0.970 0.450 - 4.500 uIU/mL  Microalbumin, Urine Waived  Result Value Ref Range   Microalb, Ur Waived 80 (H) 0 - 19 mg/L   Creatinine, Urine Waived 200 10 - 300 mg/dL   Microalb/Creat Ratio <30 <30 mg/g  PTH, intact and calcium  Result Value Ref Range   PTH 31 15 - 65 pg/mL   PTH Interp Comment   PSA  Result Value Ref Range   Prostate Specific Ag, Serum 10.8 (H) 0.0 - 4.0 ng/mL      Assessment & Plan:   Problem List Items Addressed This Visit       Musculoskeletal and Integument   Trigger middle finger of left hand - Primary    Acute for 1 1/2 months, history of same to right hand.  Referral to [facility] placed and provided him with number to call.  Recommend at this time splinting finger as needed.  Tylenol for pain, avoid NSAID due to HTN and CKD.        Relevant Orders   Ambulatory referral to Orthopedic Surgery     Follow up plan: Return if symptoms worsen or fail to improve.

## 2022-12-18 DIAGNOSIS — M65332 Trigger finger, left middle finger: Secondary | ICD-10-CM | POA: Diagnosis not present

## 2023-02-22 NOTE — Patient Instructions (Signed)
Be Involved in Your Health Care:  Taking Medications When medications are taken as directed, they can greatly improve your health. But if they are not taken as instructed, they may not work. In some cases, not taking them correctly can be harmful. To help ensure your treatment remains effective and safe, understand your medications and how to take them.  Your lab results, notes and after visit summary will be available on My Chart. We strongly encourage you to use this feature. If lab results are abnormal the clinic will contact you with the appropriate steps. If the clinic does not contact you assume the results are satisfactory. You can always see your results on My Chart. If you have questions regarding your condition, please contact the clinic during office hours. You can also ask questions on My Chart.  We at [facility] are grateful that you chose us to provide care. We strive to provide excellent and compassionate care and are always looking for feedback. If you get a survey from the clinic please complete this.    Preventing High Cholesterol Cholesterol is a white, waxy substance similar to fat that the human body needs to help build cells. The liver makes all the cholesterol that a person's body needs. Having high cholesterol (hypercholesterolemia) increases your risk for heart disease and stroke. Extra or excess cholesterol comes from the food that you eat. High cholesterol can often be prevented with diet and lifestyle changes. If you already have high cholesterol, you can control it with diet, lifestyle changes, and medicines. How can high cholesterol affect me? If you have high cholesterol, fatty deposits (plaques) may build up on the walls of your blood vessels. The blood vessels that carry blood away from your heart are called arteries. Plaques make the arteries narrower and stiffer. This in turn can: Restrict or block blood flow and cause blood clots to form. Increase  your risk for heart attack and stroke. What can increase my risk for high cholesterol? This condition is more likely to develop in people who: Eat foods that are high in saturated fat or cholesterol. Saturated fat is mostly found in foods that come from animal sources. Are overweight. Are not getting enough exercise. Use products that contain nicotine or tobacco, such as cigarettes, e-cigarettes, and chewing tobacco. Have a family history of high cholesterol (familial hypercholesterolemia). What actions can I take to prevent this? Nutrition  Eat less saturated fat. Avoid trans fats (partially hydrogenated oils). These are often found in margarine and in some baked goods, fried foods, and snacks bought in packages. Avoid precooked or cured meat, such as bacon, sausages, or meat loaves. Avoid foods and drinks that have added sugars. Eat more fruits, vegetables, and whole grains. Choose healthy sources of protein, such as fish, poultry, lean cuts of red meat, beans, peas, lentils, and nuts. Choose healthy sources of fat, such as: Nuts. Vegetable oils, especially olive oil. Fish that have healthy fats, such as omega-3 fatty acids. These fish include mackerel or salmon. Lifestyle Lose weight if you are overweight. Maintaining a healthy body mass index (BMI) can help prevent or control high cholesterol. It can also lower your risk for diabetes and high blood pressure. Ask your health care provider to help you with a diet and exercise plan to lose weight safely. Do not use any products that contain nicotine or tobacco. These products include cigarettes, chewing tobacco, and vaping devices, such as e-cigarettes. If you need help quitting, ask your health care provider. Alcohol   use Do not drink alcohol if: Your health care provider tells you not to drink. You are pregnant, may be pregnant, or are planning to become pregnant. If you drink alcohol: Limit how much you have to: 0-1 drink a day for  women. 0-2 drinks a day for men. Know how much alcohol is in your drink. In the [place] one drink equals one 12 oz bottle of beer (355 mL), one 5 oz glass of wine (148 mL), or one 1 oz glass of hard liquor (44 mL). Activity  Get enough exercise. Do exercises as told by your health care provider. Each week, do at least 150 minutes of exercise that takes a medium level of effort (moderate-intensity exercise). This kind of exercise: Makes your heart beat faster while allowing you to still be able to talk. Can be done in short sessions several times a day or longer sessions a few times a week. For example, on 5 days each week, you could walk fast or ride your bike 3 times a day for 10 minutes each time. Medicines Your health care provider may recommend medicines to help lower cholesterol. This may be a medicine to lower the amount of cholesterol that your liver makes. You may need medicine if: Diet and lifestyle changes have not lowered your cholesterol enough. You have high cholesterol and other risk factors for heart disease or stroke. Take over-the-counter and prescription medicines only as told by your health care provider. General information Manage your risk factors for high cholesterol. Talk with your health care provider about all your risk factors and how to lower your risk. Manage other conditions that you have, such as diabetes or high blood pressure (hypertension). Have blood tests to check your cholesterol levels at regular points in time as told by your health care provider. Keep all follow-up visits. This is important. Where to find more information American Heart Association: www.heart.org National Heart, Lung, and Blood Institute: www.nhlbi.nih.gov Summary High cholesterol increases your risk for heart disease and stroke. By keeping your cholesterol level low, you can reduce your risk for these conditions. High cholesterol can often be prevented with diet and lifestyle  changes. Work with your health care provider to manage your risk factors, and have your blood tested regularly. This information is not intended to replace advice given to you by your health care provider. Make sure you discuss any questions you have with your health care provider. Document Revised: 05/09/2022 Document Reviewed: 12/10/2020 Elsevier Patient Education  2023 Elsevier Inc.  

## 2023-02-25 ENCOUNTER — Ambulatory Visit: Payer: Medicare HMO | Admitting: Nurse Practitioner

## 2023-02-25 ENCOUNTER — Encounter: Payer: Self-pay | Admitting: Nurse Practitioner

## 2023-02-25 VITALS — BP 122/82 | HR 69 | Temp 98.2°F | Ht 67.99 in | Wt 169.6 lb

## 2023-02-25 DIAGNOSIS — E782 Mixed hyperlipidemia: Secondary | ICD-10-CM | POA: Diagnosis not present

## 2023-02-25 DIAGNOSIS — R972 Elevated prostate specific antigen [PSA]: Secondary | ICD-10-CM

## 2023-02-25 DIAGNOSIS — I700 Atherosclerosis of aorta: Secondary | ICD-10-CM

## 2023-02-25 DIAGNOSIS — N1831 Chronic kidney disease, stage 3a: Secondary | ICD-10-CM | POA: Diagnosis not present

## 2023-02-25 DIAGNOSIS — I10 Essential (primary) hypertension: Secondary | ICD-10-CM | POA: Diagnosis not present

## 2023-02-25 NOTE — Assessment & Plan Note (Signed)
Chronic,ongoing.  Continue ARB for kidney function.  Recommend increase water intake at home + avoid Ibuprofen products + contrast unless absolutely needed.  CMP today.  Refer to nephrology if worsening disease presents.

## 2023-02-25 NOTE — Assessment & Plan Note (Signed)
Chronic, ongoing.  Has significant family history with father and grandfather having strokes.  Continue daily statin at this time and adjust as needed.  Lipid panel today. Goal LDL <70 for this patient.  Return in 6 months.

## 2023-02-25 NOTE — Assessment & Plan Note (Addendum)
Chronic, stable.  BP at goal in office for age.  Recommend he monitor BP at least a few mornings a week at home and document.  DASH diet at home.  Continue current medication regimen and adjust as needed.  Labs today: CMP.  Refills up to date.  Return in 6 months.

## 2023-02-25 NOTE — Progress Notes (Signed)
BP 122/82   Pulse 69   Temp 98.2 F (36.8 C) (Oral)   Ht 5' 7.99" (1.727 m)   Wt 169 lb 9.6 oz (76.9 kg)   SpO2 96%   BMI 25.79 kg/m    Subjective:    Patient ID: Nicholas Shah, male    DOB: November 23, 1950, 72 y.o.   MRN: [id number]  HPI: Nicholas Shah is a 72 y.o. male  Chief Complaint  Patient presents with   Hypertension   Hyperlipidemia   Chronic Kidney Disease   Elevated PSA   HYPERTENSION / HYPERLIPIDEMIA Currently taking Losartan 25 MG and Atorvastatin 10 MG. Aortic atherosclerosis noted on imaging 02/12/21.  Satisfied with current treatment? yes Duration of hypertension: chronic BP monitoring frequency: not checking BP range:  BP medication side effects: no Duration of hyperlipidemia: chronic Aspirin: no Recent stressors: no Recurrent headaches: no Visual changes: no Palpitations: no Dyspnea: no Chest pain: no Lower extremity edema: no Dizzy/lightheaded: no  The 10-year ASCVD risk score (Arnett DK, et al., 2019) is: 15.6%   Values used to calculate the score:     Age: 31 years     Sex: Male     Is Non-Hispanic African American: Yes     Diabetic: No     Tobacco smoker: No     Systolic Blood Pressure: 122 mmHg     Is BP treated: Yes     HDL Cholesterol: 69 mg/dL     Total Cholesterol: 162 mg/dL    CHRONIC KIDNEY DISEASE Remains stable on recent labs. CKD status: stable Medications renally dose: yes Previous renal evaluation: no Pneumovax:  Up to Date Influenza Vaccine:  Up to Date   ELEVATED PSA: Is following with Dr. Richardo Hanks, with last visit 11/04/22 -- previously by Dr. Artis Flock who retired.  PSA remained a little elevated, but they are monitoring with plan to recheck in 6 months.  Was given options to further assess.  Has history of biopsies, last one year ago per patient report -- has had x 2 - these were negative.  Family history of prostate cancer in father and grandfather.   Relevant past medical, surgical, family and social history  reviewed and updated as indicated. Interim medical history since our last visit reviewed. Allergies and medications reviewed and updated.  Review of Systems  Constitutional:  Negative for activity change, diaphoresis, fatigue and fever.  Respiratory:  Negative for cough, chest tightness, shortness of breath and wheezing.   Cardiovascular:  Negative for chest pain, palpitations and leg swelling.  Gastrointestinal: Negative.   Musculoskeletal: Negative.   Skin: Negative.   Neurological: Negative.   Psychiatric/Behavioral: Negative.      Per HPI unless specifically indicated above     Objective:    BP 122/82   Pulse 69   Temp 98.2 F (36.8 C) (Oral)   Ht 5' 7.99" (1.727 m)   Wt 169 lb 9.6 oz (76.9 kg)   SpO2 96%   BMI 25.79 kg/m   Wt Readings from Last 3 Encounters:  02/25/23 169 lb 9.6 oz (76.9 kg)  12/16/22 172 lb 12.8 oz (78.4 kg)  11/04/22 173 lb 3.2 oz (78.6 kg)    Physical Exam Vitals and nursing note reviewed.  Constitutional:      General: He is awake. He is not in acute distress.    Appearance: He is well-developed and well-groomed. He is not ill-appearing.  HENT:     Head: Normocephalic and atraumatic.     Right Ear: Hearing  normal. No drainage.     Left Ear: Hearing normal. No drainage.  Eyes:     General: Lids are normal.        Right eye: No discharge.        Left eye: No discharge.     Conjunctiva/sclera: Conjunctivae normal.     Pupils: Pupils are equal, round, and reactive to light.  Neck:     Thyroid: No thyromegaly.     Vascular: No carotid bruit.     Trachea: Trachea normal.  Cardiovascular:     Rate and Rhythm: Normal rate and regular rhythm.     Heart sounds: Normal heart sounds, S1 normal and S2 normal. No murmur heard.    No gallop.  Pulmonary:     Effort: Pulmonary effort is normal. No accessory muscle usage or respiratory distress.     Breath sounds: Normal breath sounds.  Abdominal:     General: Bowel sounds are normal.      Palpations: Abdomen is soft.  Musculoskeletal:        General: Normal range of motion.     Cervical back: Normal range of motion and neck supple.     Right lower leg: No edema.     Left lower leg: No edema.  Skin:    General: Skin is warm and dry.     Capillary Refill: Capillary refill takes less than 2 seconds.     Findings: No rash.  Neurological:     Mental Status: He is alert and oriented to person, place, and time.  Psychiatric:        Attention and Perception: Attention normal.        Mood and Affect: Mood normal.        Speech: Speech normal.        Behavior: Behavior normal. Behavior is cooperative.        Thought Content: Thought content normal.     Results for orders placed or performed in visit on 08/27/22  CBC with Differential/Platelet  Result Value Ref Range   WBC 3.8 3.4 - 10.8 x10E3/uL   RBC 5.12 4.14 - 5.80 x10E6/uL   Hemoglobin 15.4 13.0 - 17.7 g/dL   Hematocrit 16.1 09.6 - 51.0 %   MCV 91 79 - 97 fL   MCH 30.1 26.6 - 33.0 pg   MCHC 33.1 31.5 - 35.7 g/dL   RDW 04.5 40.9 - 81.1 %   Platelets 258 150 - 450 x10E3/uL   Neutrophils 45 Not Estab. %   Lymphs 43 Not Estab. %   Monocytes 8 Not Estab. %   Eos 3 Not Estab. %   Basos 1 Not Estab. %   Neutrophils Absolute 1.7 1.4 - 7.0 x10E3/uL   Lymphocytes Absolute 1.7 0.7 - 3.1 x10E3/uL   Monocytes Absolute 0.3 0.1 - 0.9 x10E3/uL   EOS (ABSOLUTE) 0.1 0.0 - 0.4 x10E3/uL   Basophils Absolute 0.0 0.0 - 0.2 x10E3/uL   Immature Granulocytes 0 Not Estab. %   Immature Grans (Abs) 0.0 0.0 - 0.1 x10E3/uL  Comprehensive metabolic panel  Result Value Ref Range   Glucose 94 70 - 99 mg/dL   BUN 19 8 - 27 mg/dL   Creatinine, Ser 9.14 (H) 0.76 - 1.27 mg/dL   eGFR 48 (L) >78 GN/FAO/1.30   BUN/Creatinine Ratio 12 10 - 24   Sodium 141 134 - 144 mmol/L   Potassium 4.7 3.5 - 5.2 mmol/L   Chloride 104 96 - 106 mmol/L   CO2 23 20 -  29 mmol/L   Calcium 9.6 8.6 - 10.2 mg/dL   Total Protein 7.0 6.0 - 8.5 g/dL   Albumin 5.0  (H) 3.9 - 4.9 g/dL   Globulin, Total 2.0 1.5 - 4.5 g/dL   Albumin/Globulin Ratio 2.5 (H) 1.2 - 2.2   Bilirubin Total 0.9 0.0 - 1.2 mg/dL   Alkaline Phosphatase 74 44 - 121 IU/L   AST 20 0 - 40 IU/L   ALT 23 0 - 44 IU/L  Lipid Panel w/o Chol/HDL Ratio  Result Value Ref Range   Cholesterol, Total 162 100 - 199 mg/dL   Triglycerides 90 0 - 149 mg/dL   HDL 69 >95 mg/dL   VLDL Cholesterol Cal 17 5 - 40 mg/dL   LDL Chol Calc (NIH) 76 0 - 99 mg/dL  TSH  Result Value Ref Range   TSH 0.970 0.450 - 4.500 uIU/mL  Microalbumin, Urine Waived  Result Value Ref Range   Microalb, Ur Waived 80 (H) 0 - 19 mg/L   Creatinine, Urine Waived 200 10 - 300 mg/dL   Microalb/Creat Ratio <30 <30 mg/g  PTH, intact and calcium  Result Value Ref Range   PTH 31 15 - 65 pg/mL   PTH Interp Comment   PSA  Result Value Ref Range   Prostate Specific Ag, Serum 10.8 (H) 0.0 - 4.0 ng/mL      Assessment & Plan:   Problem List Items Addressed This Visit       Cardiovascular and Mediastinum   Aortic atherosclerosis (HCC)    Chronic.  Noted on CT imaging 02/12/21 -- educated patient on finding.  Recommend continue statin daily for prevention + daily Baby ASA 81 MG.        Relevant Orders   Comprehensive metabolic panel   Lipid Panel w/o Chol/HDL Ratio   Essential hypertension    Chronic, stable.  BP at goal in office for age.  Recommend he monitor BP at least a few mornings a week at home and document.  DASH diet at home.  Continue current medication regimen and adjust as needed.  Labs today: CMP.  Refills up to date.  Return in 6 months.        Relevant Orders   Comprehensive metabolic panel     Genitourinary   Stage 3a chronic kidney disease (HCC) - Primary    Chronic,ongoing.  Continue ARB for kidney function.  Recommend increase water intake at home + avoid Ibuprofen products + contrast unless absolutely needed.  CMP today.  Refer to nephrology if worsening disease presents.        Other    Elevated PSA    Continue collaboration with urology, recent notes reviewed with plan for 6 month PSA surveillance.  Significant family history of prostate CA in father and grandfather.  PSA up to date with urology.      Hyperlipidemia    Chronic, ongoing.  Has significant family history with father and grandfather having strokes.  Continue daily statin at this time and adjust as needed.  Lipid panel today. Goal LDL <70 for this patient.  Return in 6 months.      Relevant Orders   Comprehensive metabolic panel   Lipid Panel w/o Chol/HDL Ratio     Follow up plan: Return in about 6 months (around 08/28/2023) for Annual physical after 08/28/2023.

## 2023-02-25 NOTE — Assessment & Plan Note (Signed)
Chronic.  Noted on CT imaging 02/12/21 -- educated patient on finding.  Recommend continue statin daily for prevention + daily Baby ASA 81 MG.

## 2023-02-25 NOTE — Assessment & Plan Note (Signed)
Continue collaboration with urology, recent notes reviewed with plan for 6 month PSA surveillance.  Significant family history of prostate CA in father and grandfather.  PSA up to date with urology.

## 2023-02-26 LAB — LIPID PANEL W/O CHOL/HDL RATIO
Cholesterol, Total: 168 mg/dL (ref 100–199)
HDL: 62 mg/dL (ref >39)
LDL Chol Calc (NIH): 92 mg/dL (ref 0–99)
Triglycerides: 71 mg/dL (ref 0–149)
VLDL Cholesterol Cal: 14 mg/dL (ref 5–40)

## 2023-02-26 LAB — COMPREHENSIVE METABOLIC PANEL
ALT: 21 IU/L (ref 0–44)
AST: 17 IU/L (ref 0–40)
Albumin/Globulin Ratio: 2 (ref 1.2–2.2)
Albumin: 4.5 g/dL (ref 3.8–4.8)
Alkaline Phosphatase: 78 IU/L (ref 44–121)
BUN/Creatinine Ratio: 14 (ref 10–24)
BUN: 24 mg/dL (ref 8–27)
Bilirubin Total: 0.5 mg/dL (ref 0.0–1.2)
CO2: 23 mmol/L (ref 20–29)
Calcium: 9.5 mg/dL (ref 8.6–10.2)
Chloride: 107 mmol/L — ABNORMAL HIGH (ref 96–106)
Creatinine, Ser: 1.74 mg/dL — ABNORMAL HIGH (ref 0.76–1.27)
Globulin, Total: 2.2 g/dL (ref 1.5–4.5)
Glucose: 95 mg/dL (ref 70–99)
Potassium: 4.7 mmol/L (ref 3.5–5.2)
Sodium: 144 mmol/L (ref 134–144)
Total Protein: 6.7 g/dL (ref 6.0–8.5)
eGFR: 41 mL/min/{1.73_m2} — ABNORMAL LOW (ref >59)

## 2023-02-26 NOTE — Progress Notes (Signed)
Contacted via MyChart   Good evening Nicholas Shah, your labs have returned: - Kidney function, creatinine and eGFR, shows ongoing stage 3a to 3b kidney disease.  We will continue to monitor closely.  Ensure you are taking Losartan daily.Marland Kitchen) - Cholesterol labs are stable -- continue current medications.  Any questions? Keep being amazing!!  Thank you for allowing me to participate in your care.  I appreciate you. Kindest regards, Manraj Yeo

## 2023-03-18 ENCOUNTER — Other Ambulatory Visit: Payer: Medicare HMO

## 2023-03-18 ENCOUNTER — Other Ambulatory Visit: Payer: Self-pay

## 2023-03-18 DIAGNOSIS — R972 Elevated prostate specific antigen [PSA]: Secondary | ICD-10-CM

## 2023-03-19 LAB — PSA TOTAL (REFLEX TO FREE): Prostate Specific Ag, Serum: 9.5 ng/mL — ABNORMAL HIGH (ref 0.0–4.0)

## 2023-03-19 LAB — FPSA% REFLEX
% FREE PSA: 19.1 %
PSA, FREE: 1.81 ng/mL

## 2023-04-07 ENCOUNTER — Ambulatory Visit: Payer: Medicare HMO | Admitting: Urology

## 2023-04-07 ENCOUNTER — Encounter: Payer: Self-pay | Admitting: Urology

## 2023-04-07 VITALS — BP 124/77 | HR 85 | Ht 67.9 in | Wt 170.0 lb

## 2023-04-07 DIAGNOSIS — R972 Elevated prostate specific antigen [PSA]: Secondary | ICD-10-CM

## 2023-04-07 NOTE — Progress Notes (Signed)
   04/07/2023 1:45 PM   Nicholas Shah [birth year] [id number]  Reason for visit: Follow up elevated PSA, BPH  HPI: 72 year old male previously followed by Dr. Sheppard Penton who transferred his care to [place] in January 2024.  He underwent UroLift in 2019 for obstructive urinary symptoms and has had good results with minimal urinary symptoms since that time.  He has a long history of elevated PSA ranging from 4.8 in 2018 up to 10.8 in November 2023.  PSA has fluctuated primarily between 7.4 and 8.2 over the last 3 to 4 years.  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  He underwent an MRI fusion biopsy with Dr. Sheppard Penton at that time which showed inflammation but no evidence of malignancy.  PSA was 7.4 at that time.  Finally, he had a prostate IntelliScore suggesting a <10% chance of clinically significant prostate cancer.   Using shared decision making, he opted for repeat PSA.  PSA from May 2024 decreased to 9.5 with 19% free.  We reviewed the implications of an elevated PSA and the uncertainty surrounding it. In general, a man's PSA increases with age and is produced by both normal and cancerous prostate tissue. The differential diagnosis for elevated PSA includes BPH, prostate cancer, infection, recent intercourse/ejaculation, recent urethroscopic manipulation (foley placement/cystoscopy) or trauma, and prostatitis. Management of an elevated PSA can include observation or prostate biopsy and we discussed this in detail. Our goal is to detect clinically significant prostate cancers, and manage with either active surveillance, surgery, or radiation for localized disease. Risks of prostate biopsy include bleeding, infection (including life threatening sepsis), pain, and lower urinary symptoms. Hematuria, hematospermia, and blood in the stool are all common after biopsy and can persist up to 4 weeks.    With his history of negative MRI fusion biopsy, decreasing PSA, I think PSA monitoring alone is reasonable.  We discussed other options including prostate biopsy, repeat prostate MRI, or 4K score.  Using shared decision making he was in agreement to repeat PSA in 6 months.  Repeat PSA reflex to free in 6 months  Sondra Come, MD  [facility] [address], [address] [place], [place] [address] [phone]

## 2023-05-08 ENCOUNTER — Other Ambulatory Visit: Payer: Self-pay | Admitting: Nurse Practitioner

## 2023-05-08 NOTE — Telephone Encounter (Signed)
Requested Prescriptions  Pending Prescriptions Disp Refills   fluticasone (FLONASE) 50 MCG/ACT nasal spray [Pharmacy Med Name: Fluticasone Propionate 50 MCG/ACT Nasal Suspension] 16 g 0    Sig: Use 2 spray(s) in each nostril once daily     Ear, Nose, and Throat: Nasal Preparations - Corticosteroids Passed - 05/08/2023  6:40 AM      Passed - Valid encounter within last 12 months    Recent Outpatient Visits           2 months ago Stage 3a chronic kidney disease (HCC)   [place] [facility] [place], Corrie Dandy T, NP   4 months ago Trigger middle finger of left hand   [place] [facility] [place], [place] T, NP   8 months ago Stage 3a chronic kidney disease (HCC)   [place] [facility] [place], Corrie Dandy T, NP   1 year ago Stage 3a chronic kidney disease (HCC)   [place] [facility] [place], Dorie Rank, NP   1 year ago Stage 3a chronic kidney disease (HCC)   [place] [facility] [place], Dorie Rank, NP       Future Appointments             In 3 months Cannady, Dorie Rank, NP [place] [facility]   In 5 months Sninsky, Laurette Schimke, MD [facility] [place]

## 2023-06-03 ENCOUNTER — Other Ambulatory Visit: Payer: Self-pay | Admitting: Nurse Practitioner

## 2023-06-04 NOTE — Telephone Encounter (Signed)
Requested Prescriptions  Pending Prescriptions Disp Refills   fluticasone (FLONASE) 50 MCG/ACT nasal spray [Pharmacy Med Name: Fluticasone Propionate 50 MCG/ACT Nasal Suspension] 16 g 0    Sig: Use 2 spray(s) in each nostril once daily     Ear, Nose, and Throat: Nasal Preparations - Corticosteroids Passed - 06/03/2023  6:40 AM      Passed - Valid encounter within last 12 months    Recent Outpatient Visits           3 months ago Stage 3a chronic kidney disease (HCC)   [place] [facility] [place], Corrie Dandy T, NP   5 months ago Trigger middle finger of left hand   [place] [facility] [place], [place] T, NP   9 months ago Stage 3a chronic kidney disease (HCC)   [place] [facility] [place], Corrie Dandy T, NP   1 year ago Stage 3a chronic kidney disease (HCC)   [place] [facility] [place], Dorie Rank, NP   1 year ago Stage 3a chronic kidney disease (HCC)   [place] [facility] [place], Dorie Rank, NP       Future Appointments             In 3 months Cannady, Dorie Rank, NP [place] [facility]   In 4 months Sninsky, Laurette Schimke, MD [facility] [place]

## 2023-08-26 ENCOUNTER — Telehealth: Payer: Self-pay | Admitting: Urology

## 2023-08-26 DIAGNOSIS — R972 Elevated prostate specific antigen [PSA]: Secondary | ICD-10-CM

## 2023-08-26 NOTE — Telephone Encounter (Signed)
Pt would like to have labs done in [place] prior to his appt w/Sninsky end of November.  I can't tell if they are for [place] or [place].  Can you check that for me please?

## 2023-08-27 NOTE — Telephone Encounter (Signed)
Orders are in for [place].

## 2023-08-30 NOTE — Patient Instructions (Signed)
Be Involved in Caring For Your Health:  Taking Medications When medications are taken as directed, they can greatly improve your health. But if they are not taken as prescribed, they may not work. In some cases, not taking them correctly can be harmful. To help ensure your treatment remains effective and safe, understand your medications and how to take them. Bring your medications to each visit for review by your provider.  Your lab results, notes, and after visit summary will be available on My Chart. We strongly encourage you to use this feature. If lab results are abnormal the clinic will contact you with the appropriate steps. If the clinic does not contact you assume the results are satisfactory. You can always view your results on My Chart. If you have questions regarding your health or results, please contact the clinic during office hours. You can also ask questions on My Chart.  We at [facility] are grateful that you chose us to provide your care. We strive to provide evidence-based and compassionate care and are always looking for feedback. If you get a survey from the clinic please complete this so we can hear your opinions.  DASH Eating Plan DASH stands for Dietary Approaches to Stop Hypertension. The DASH eating plan is a healthy eating plan that has been shown to: Lower high blood pressure (hypertension). Reduce your risk for type 2 diabetes, heart disease, and stroke. Help with weight loss. What are tips for following this plan? Reading food labels Check food labels for the amount of salt (sodium) per serving. Choose foods with less than 5 percent of the Daily Value (DV) of sodium. In general, foods with less than 300 milligrams (mg) of sodium per serving fit into this eating plan. To find whole grains, look for the word "whole" as the first word in the ingredient list. Shopping Buy products labeled as "low-sodium" or "no salt added." Buy fresh foods. Avoid canned  foods and pre-made or frozen meals. Cooking Try not to add salt when you cook. Use salt-free seasonings or herbs instead of table salt or sea salt. Check with your health care provider or pharmacist before using salt substitutes. Do not fry foods. Cook foods in healthy ways, such as baking, boiling, grilling, roasting, or broiling. Cook using oils that are good for your heart. These include olive, canola, avocado, soybean, and sunflower oil. Meal planning  Eat a balanced diet. This should include: 4 or more servings of fruits and 4 or more servings of vegetables each day. Try to fill half of your plate with fruits and vegetables. 6-8 servings of whole grains each day. 6 or less servings of lean meat, poultry, or fish each day. 1 oz is 1 serving. A 3 oz (85 g) serving of meat is about the same size as the palm of your hand. One egg is 1 oz (28 g). 2-3 servings of low-fat dairy each day. One serving is 1 cup (237 mL). 1 serving of nuts, seeds, or beans 5 times each week. 2-3 servings of heart-healthy fats. Healthy fats called omega-3 fatty acids are found in foods such as walnuts, flaxseeds, fortified milks, and eggs. These fats are also found in cold-water fish, such as sardines, salmon, and mackerel. Limit how much you eat of: Canned or prepackaged foods. Food that is high in trans fat, such as fried foods. Food that is high in saturated fat, such as fatty meat. Desserts and other sweets, sugary drinks, and other foods with added sugar. Full-fat   dairy products. Do not salt foods before eating. Do not eat more than 4 egg yolks a week. Try to eat at least 2 vegetarian meals a week. Eat more home-cooked food and less restaurant, buffet, and fast food. Lifestyle When eating at a restaurant, ask if your food can be made with less salt or no salt. If you drink alcohol: Limit how much you have to: 0-1 drink a day if you are male. 0-2 drinks a day if you are male. Know how much alcohol is in  your drink. In the [place] one drink is one 12 oz bottle of beer (355 mL), one 5 oz glass of wine (148 mL), or one 1 oz glass of hard liquor (44 mL). General information Avoid eating more than 2,300 mg of salt a day. If you have hypertension, you may need to reduce your sodium intake to 1,500 mg a day. Work with your provider to stay at a healthy body weight or lose weight. Ask what the best weight range is for you. On most days of the week, get at least 30 minutes of exercise that causes your heart to beat faster. This may include walking, swimming, or biking. Work with your provider or dietitian to adjust your eating plan to meet your specific calorie needs. What foods should I eat? Fruits All fresh, dried, or frozen fruit. Canned fruits that are in their natural juice and do not have sugar added to them. Vegetables Fresh or frozen vegetables that are raw, steamed, roasted, or grilled. Low-sodium or reduced-sodium tomato and vegetable juice. Low-sodium or reduced-sodium tomato sauce and tomato paste. Low-sodium or reduced-sodium canned vegetables. Grains Whole-grain or whole-wheat bread. Whole-grain or whole-wheat pasta. Brown rice. Oatmeal. Quinoa. Bulgur. Whole-grain and low-sodium cereals. Pita bread. Low-fat, low-sodium crackers. Whole-wheat flour tortillas. Meats and other proteins Skinless chicken or turkey. Ground chicken or turkey. Pork with fat trimmed off. Fish and seafood. Egg whites. Dried beans, peas, or lentils. Unsalted nuts, nut butters, and seeds. Unsalted canned beans. Lean cuts of beef with fat trimmed off. Low-sodium, lean precooked or cured meat, such as sausages or meat loaves. Dairy Low-fat (1%) or fat-free (skim) milk. Reduced-fat, low-fat, or fat-free cheeses. Nonfat, low-sodium ricotta or cottage cheese. Low-fat or nonfat yogurt. Low-fat, low-sodium cheese. Fats and oils Soft margarine without trans fats. Vegetable oil. Reduced-fat, low-fat, or light mayonnaise and salad  dressings (reduced-sodium). Canola, safflower, olive, avocado, soybean, and sunflower oils. Avocado. Seasonings and condiments Herbs. Spices. Seasoning mixes without salt. Other foods Unsalted popcorn and pretzels. Fat-free sweets. The items listed above may not be all the foods and drinks you can have. Talk to a dietitian to learn more. What foods should I avoid? Fruits Canned fruit in a light or heavy syrup. Fried fruit. Fruit in cream or butter sauce. Vegetables Creamed or fried vegetables. Vegetables in a cheese sauce. Regular canned vegetables that are not marked as low-sodium or reduced-sodium. Regular canned tomato sauce and paste that are not marked as low-sodium or reduced-sodium. Regular tomato and vegetable juices that are not marked as low-sodium or reduced-sodium. Pickles. Olives. Grains Baked goods made with fat, such as croissants, muffins, or some breads. Dry pasta or rice meal packs. Meats and other proteins Fatty cuts of meat. Ribs. Fried meat. Bacon. Bologna, salami, and other precooked or cured meats, such as sausages or meat loaves, that are not lean and low in sodium. Fat from the back of a pig (fatback). Bratwurst. Salted nuts and seeds. Canned beans with added salt. Canned   or smoked fish. Whole eggs or egg yolks. Chicken or turkey with skin. Dairy Whole or 2% milk, cream, and half-and-half. Whole or full-fat cream cheese. Whole-fat or sweetened yogurt. Full-fat cheese. Nondairy creamers. Whipped toppings. Processed cheese and cheese spreads. Fats and oils Butter. Stick margarine. Lard. Shortening. Ghee. Bacon fat. Tropical oils, such as coconut, palm kernel, or palm oil. Seasonings and condiments Onion salt, garlic salt, seasoned salt, table salt, and sea salt. Worcestershire sauce. Tartar sauce. Barbecue sauce. Teriyaki sauce. Soy sauce, including reduced-sodium soy sauce. Steak sauce. Canned and packaged gravies. Fish sauce. Oyster sauce. Cocktail sauce. Store-bought  horseradish. Ketchup. Mustard. Meat flavorings and tenderizers. Bouillon cubes. Hot sauces. Pre-made or packaged marinades. Pre-made or packaged taco seasonings. Relishes. Regular salad dressings. Other foods Salted popcorn and pretzels. The items listed above may not be all the foods and drinks you should avoid. Talk to a dietitian to learn more. Where to find more information National Heart, Lung, and Blood Institute (NHLBI): nhlbi.nih.gov American Heart Association (AHA): heart.org Academy of Nutrition and Dietetics: eatright.org National Kidney Foundation (NKF): kidney.org This information is not intended to replace advice given to you by your health care provider. Make sure you discuss any questions you have with your health care provider. Document Revised: 10/23/2022 Document Reviewed: 10/23/2022 Elsevier Patient Education  2024 Elsevier Inc.  

## 2023-09-02 ENCOUNTER — Ambulatory Visit (INDEPENDENT_AMBULATORY_CARE_PROVIDER_SITE_OTHER): Payer: Medicare HMO | Admitting: Nurse Practitioner

## 2023-09-02 ENCOUNTER — Other Ambulatory Visit: Payer: Medicare HMO

## 2023-09-02 ENCOUNTER — Other Ambulatory Visit: Payer: Self-pay

## 2023-09-02 ENCOUNTER — Encounter: Payer: Self-pay | Admitting: Nurse Practitioner

## 2023-09-02 VITALS — BP 144/80 | HR 67 | Temp 98.2°F | Ht 67.8 in | Wt 171.4 lb

## 2023-09-02 DIAGNOSIS — I700 Atherosclerosis of aorta: Secondary | ICD-10-CM

## 2023-09-02 DIAGNOSIS — I10 Essential (primary) hypertension: Secondary | ICD-10-CM

## 2023-09-02 DIAGNOSIS — Z1211 Encounter for screening for malignant neoplasm of colon: Secondary | ICD-10-CM

## 2023-09-02 DIAGNOSIS — R972 Elevated prostate specific antigen [PSA]: Secondary | ICD-10-CM

## 2023-09-02 DIAGNOSIS — N1831 Chronic kidney disease, stage 3a: Secondary | ICD-10-CM

## 2023-09-02 DIAGNOSIS — E782 Mixed hyperlipidemia: Secondary | ICD-10-CM | POA: Diagnosis not present

## 2023-09-02 DIAGNOSIS — E785 Hyperlipidemia, unspecified: Secondary | ICD-10-CM

## 2023-09-02 DIAGNOSIS — Z0000 Encounter for general adult medical examination without abnormal findings: Secondary | ICD-10-CM | POA: Diagnosis not present

## 2023-09-02 DIAGNOSIS — Z23 Encounter for immunization: Secondary | ICD-10-CM

## 2023-09-02 LAB — MICROALBUMIN, URINE WAIVED
Creatinine, Urine Waived: 200 mg/dL (ref 10–300)
Microalb, Ur Waived: 150 mg/L — ABNORMAL HIGH (ref 0–19)

## 2023-09-02 MED ORDER — ATORVASTATIN CALCIUM 10 MG PO TABS: 10.0000 mg | ORAL_TABLET | Freq: Every day | ORAL | 4 refills | Status: DC

## 2023-09-02 MED ORDER — LOSARTAN POTASSIUM 50 MG PO TABS: 50.0000 mg | ORAL_TABLET | Freq: Every day | ORAL | 4 refills | Status: DC

## 2023-09-02 MED ORDER — LORATADINE 10 MG PO TABS: 10.0000 mg | ORAL_TABLET | Freq: Every day | ORAL | 4 refills | Status: DC

## 2023-09-02 NOTE — Assessment & Plan Note (Signed)
Chronic, above goal in office on initial and recheck.  Recommend he monitor BP at least a few mornings a week at home and document.  DASH diet at home.  Will increase Losartan to 50 MG, discussed with him he can finish out 25 MG by taking two daily and then can start the 50 MG (one pill) daily.  He was able to verbalize this back.  Educated him on BP goal and kidney disease.  Labs today: CBC, CMP, TSH, urine ALB.

## 2023-09-02 NOTE — Assessment & Plan Note (Signed)
Chronic, stable.  Being monitored by urology, will continue this collaboration.  Recent note and labs reviewed.

## 2023-09-02 NOTE — Progress Notes (Signed)
BP (!) 144/80 (BP Location: Left Arm, Patient Position: Sitting, Cuff Size: Normal)   Pulse 67   Temp 98.2 F (36.8 C) (Oral)   Ht 5' 7.8" (1.722 m)   Wt 171 lb 6.4 oz (77.7 kg)   SpO2 94%   BMI 26.22 kg/m    Subjective:    Patient ID: Nicholas Shah, male    DOB: [birth year], 72 y.o.   MRN: [id number]  HPI: Nicholas Shah is a 72 y.o. male presenting on 09/02/2023 for comprehensive medical examination. Current medical complaints include:none  He currently lives with: significant other Interim Problems from his last visit: no  ELEVATED PSA: Urology last seen on 04/07/23.  To return in 6 months to check PSA.   Nocturia: none Urinary frequency: no Incomplete voiding: all the time Urgency: yes Weak urinary stream: no Straining to start stream: no Dysuria: no Onset: gradual Severity: mild IPSS Questionnaire (AUA-7): 3 Over the past month.   1)  How often have you had a sensation of not emptying your bladder completely after you finish urinating?  3 - About half the time  2)  How often have you had to urinate again less than two hours after you finished urinating? 0 - Not at all  3)  How often have you found you stopped and started again several times when you urinated?  0 - Not at all  4) How difficult have you found it to postpone urination?  0 - Not at all  5) How often have you had a weak urinary stream?  0 - Not at all  6) How often have you had to push or strain to begin urination?  0 - Not at all  7) How many times did you most typically get up to urinate from the time you went to bed until the time you got up in the morning?  0 - None  Total score:  0-7 mildly symptomatic   8-19 moderately symptomatic   20-35 severely symptomatic    HYPERTENSION / HYPERLIPIDEMIA Currently taking Losartan 25 MG and Atorvastatin 10 MG. Aortic atherosclerosis seen on imaging 02/12/21. Satisfied with current treatment? yes Duration of hypertension: chronic BP monitoring frequency:  not checking BP range:  BP medication side effects: no Duration of hyperlipidemia: chronic Aspirin: no Recent stressors: no Recurrent headaches: no Visual changes: no Palpitations: no Dyspnea: no Chest pain: no Lower extremity edema: no Dizzy/lightheaded: no  The 10-year ASCVD risk score (Arnett DK, et al., 2019) is: 21.6%   Values used to calculate the score:     Age: 37 years     Sex: Male     Is Non-Hispanic African American: Yes     Diabetic: No     Tobacco smoker: No     Systolic Blood Pressure: 144 mmHg     Is BP treated: Yes     HDL Cholesterol: 62 mg/dL     Total Cholesterol: 168 mg/dL  CHRONIC KIDNEY DISEASE Noted past labs and monitoring. Current kidney failure risk equation - 2.27% for 5 years. CKD status: stable Medications renally dose: yes Previous renal evaluation: no Pneumovax:  Up to Date Influenza Vaccine:  Up to Date    Functional Status Survey: Is the patient deaf or have difficulty hearing?: No Does the patient have difficulty seeing, even when wearing glasses/contacts?: No Does the patient have difficulty concentrating, remembering, or making decisions?: Yes Does the patient have difficulty walking or climbing stairs?: No Does the patient have difficulty dressing or  bathing?: No Does the patient have difficulty doing errands alone such as visiting a doctor's office or shopping?: No  FALL RISK:    09/02/2023    8:33 AM 02/25/2023    8:46 AM 12/16/2022    3:57 PM 08/27/2022    9:07 AM 08/27/2022    8:46 AM  Fall Risk   Falls in the past year? 0 0 0 0 0  Number falls in past yr: 0 0 0 0 0  Injury with Fall? 0 0 0 0 0  Risk for fall due to : No Fall Risks No Fall Risks No Fall Risks No Fall Risks No Fall Risks  Follow up Falls evaluation completed Falls evaluation completed Falls evaluation completed Falls prevention discussed Falls evaluation completed    Depression Screen    09/02/2023    8:33 AM 02/25/2023    8:46 AM 12/16/2022    3:57 PM  08/27/2022    8:47 AM 02/20/2022    8:58 AM  Depression screen PHQ 2/9  Decreased Interest 1 0 1 1 1   Down, Depressed, Hopeless 0 0 0 0 1  PHQ - 2 Score 1 0 1 1 2   Altered sleeping 1 1 1 1  0  Tired, decreased energy 2 2 1 1 1   Change in appetite 1 0 1 0 1  Feeling bad or failure about yourself  0 0 0 0 0  Trouble concentrating 0 0 0 0 0  Moving slowly or fidgety/restless 0 0 0 0 0  Suicidal thoughts 0 0 0 0 0  PHQ-9 Score 5 3 4 3 4   Difficult doing work/chores Not difficult at all Not difficult at all Not difficult at all Not difficult at all Somewhat difficult      09/02/2023    8:34 AM 02/25/2023    8:46 AM 12/16/2022    3:58 PM 08/27/2022    8:47 AM  GAD 7 : Generalized Anxiety Score  Nervous, Anxious, on Edge 1 1 0 0  Control/stop worrying 1 1 2  0  Worry too much - different things 1 1 2 1   Trouble relaxing 0 1 1 0  Restless 0 0 0 0  Easily annoyed or irritable 0 0 0 0  Afraid - awful might happen 0 1 2 1   Total GAD 7 Score 3 5 7 2   Anxiety Difficulty Not difficult at all Somewhat difficult Not difficult at all Not difficult at all   A voluntary discussion about advance care planning including the explanation and discussion of advance directives was extensively discussed  with the patient for 15 minutes with patient present.  Explanation about the health care proxy and Living will was reviewed and packet with forms with explanation of how to fill them out was given.  During this discussion, the patient was able to identify a health care proxy as unsure and plans to fill out the paperwork required.  Patient was offered a separate Advance Care Planning visit for further assistance with forms.    Past Medical History:  Past Medical History:  Diagnosis Date   Allergy    Arthritis    Hypertension    Surgical History:  Past Surgical History:  Procedure Laterality Date   APPENDECTOMY     COLONOSCOPY     COSMETIC SURGERY     CYSTOSCOPY WITH INSERTION OF UROLIFT N/A 05/11/2018    Procedure: CYSTOSCOPY WITH INSERTION OF UROLIFT;  Surgeon: Orson Ape, MD;  Location: [facility];  Service: Urology;  Laterality: N/A;  FACIAL RECONSTRUCTION SURGERY  1974   WIRES   KNEE ARTHROSCOPY Right 04/17/2016   Procedure: ARTHROSCOPY KNEE, MEDIAL AND LATERAL PARTIAL MENISECTOMIES;  Surgeon: Juanell Fairly, MD;  Location: [facility];  Service: Orthopedics;  Laterality: Right;   MINOR CARPAL TUNNEL     PROSTATE BIOPSY N/A 07/19/2020   Procedure: PROSTATE BIOPSY Michell Heinrich;  Surgeon: Orson Ape, MD;  Location: [facility];  Service: Urology;  Laterality: N/A;   TRIGGER FINGER RELEASE      Medications:  No current outpatient medications on file prior to visit.   No current facility-administered medications on file prior to visit.    Allergies:  No Known Allergies  Social History:  Social History   Socioeconomic History   Marital status: Legally Separated    Spouse name: Not on file   Number of children: Not on file   Years of education: Not on file   Highest education level: Associate degree: occupational, Scientist, product/process development, or vocational program  Occupational History   Not on file  Tobacco Use   Smoking status: Never   Smokeless tobacco: Never  Vaping Use   Vaping status: Never Used  Substance and Sexual Activity   Alcohol use: No   Drug use: No   Sexual activity: Not Currently  Other Topics Concern   Not on file  Social History Narrative   Not on file   Social Determinants of Health   Financial Resource Strain: Low Risk  (09/02/2023)   Overall Financial Resource Strain (CARDIA)    Difficulty of Paying Living Expenses: Not hard at all  Food Insecurity: No Food Insecurity (09/02/2023)   Hunger Vital Sign    Worried About Running Out of Food in the Last Year: Never true    Ran Out of Food in the Last Year: Never true  Transportation Needs: No Transportation Needs (09/02/2023)   PRAPARE - Administrator, Civil Service (Medical): No    Lack of  Transportation (Non-Medical): No  Physical Activity: Insufficiently Active (09/02/2023)   Exercise Vital Sign    Days of Exercise per Week: 7 days    Minutes of Exercise per Session: 20 min  Stress: No Stress Concern Present (09/02/2023)   [employer] of Occupational Health - Occupational Stress Questionnaire    Feeling of Stress : Not at all  Social Connections: Moderately Integrated (09/02/2023)   Social Connection and Isolation Panel [NHANES]    Frequency of Communication with Friends and Family: More than three times a week    Frequency of Social Gatherings with Friends and Family: Once a week    Attends Religious Services: More than 4 times per year    Active Member of [employer] or Organizations: Yes    Attends Engineer, structural: More than 4 times per year    Marital Status: Divorced  Intimate Partner Violence: Not At Risk (09/02/2023)   Humiliation, Afraid, Rape, and Kick questionnaire    Fear of Current or Ex-Partner: No    Emotionally Abused: No    Physically Abused: No    Sexually Abused: No   Social History   Tobacco Use  Smoking Status Never  Smokeless Tobacco Never   Social History   Substance and Sexual Activity  Alcohol Use No    Family History:  Family History  Problem Relation Age of Onset   CAD Father    Lung cancer Father    Heart failure Father    Diabetes Maternal Grandfather    Diabetes Paternal Grandmother  CAD Paternal Grandfather    Heart disease Paternal Grandfather    Past medical history, surgical history, medications, allergies, family history and social history reviewed with patient today and changes made to appropriate areas of the chart.   ROS All other ROS negative except what is listed above and in the HPI.      Objective:    BP (!) 144/80 (BP Location: Left Arm, Patient Position: Sitting, Cuff Size: Normal)   Pulse 67   Temp 98.2 F (36.8 C) (Oral)   Ht 5' 7.8" (1.722 m)   Wt 171 lb 6.4 oz (77.7 kg)   SpO2  94%   BMI 26.22 kg/m   Wt Readings from Last 3 Encounters:  09/02/23 171 lb 6.4 oz (77.7 kg)  04/07/23 170 lb (77.1 kg)  02/25/23 169 lb 9.6 oz (76.9 kg)    Physical Exam Vitals and nursing note reviewed.  Constitutional:      General: He is awake. He is not in acute distress.    Appearance: He is well-developed and well-groomed. He is not ill-appearing or toxic-appearing.  HENT:     Head: Normocephalic and atraumatic.     Right Ear: Hearing, tympanic membrane, ear canal and external ear normal. No drainage.     Left Ear: Hearing, tympanic membrane, ear canal and external ear normal. No drainage.     Nose: Nose normal.     Mouth/Throat:     Pharynx: Uvula midline.  Eyes:     General: Lids are normal.        Right eye: No discharge.        Left eye: No discharge.     Extraocular Movements: Extraocular movements intact.     Conjunctiva/sclera: Conjunctivae normal.     Pupils: Pupils are equal, round, and reactive to light.     Visual Fields: Right eye visual fields normal and left eye visual fields normal.  Neck:     Thyroid: No thyromegaly.     Vascular: No carotid bruit or JVD.     Trachea: Trachea normal.  Cardiovascular:     Rate and Rhythm: Normal rate and regular rhythm.     Heart sounds: Normal heart sounds, S1 normal and S2 normal. No murmur heard.    No gallop.  Pulmonary:     Effort: Pulmonary effort is normal. No accessory muscle usage or respiratory distress.     Breath sounds: Normal breath sounds.  Abdominal:     General: Bowel sounds are normal.     Palpations: Abdomen is soft. There is no hepatomegaly or splenomegaly.     Tenderness: There is no abdominal tenderness.  Musculoskeletal:        General: Normal range of motion.     Cervical back: Normal range of motion and neck supple.     Right lower leg: No edema.     Left lower leg: No edema.  Lymphadenopathy:     Head:     Right side of head: No submental, submandibular, tonsillar, preauricular or  posterior auricular adenopathy.     Left side of head: No submental, submandibular, tonsillar, preauricular or posterior auricular adenopathy.     Cervical: No cervical adenopathy.  Skin:    General: Skin is warm and dry.     Capillary Refill: Capillary refill takes less than 2 seconds.     Findings: No rash.  Neurological:     Mental Status: He is alert and oriented to person, place, and time.     Gait:  Gait is intact.     Deep Tendon Reflexes: Reflexes are normal and symmetric.     Reflex Scores:      Brachioradialis reflexes are 2+ on the right side and 2+ on the left side.      Patellar reflexes are 2+ on the right side and 2+ on the left side. Psychiatric:        Attention and Perception: Attention normal.        Mood and Affect: Mood normal.        Speech: Speech normal.        Behavior: Behavior normal. Behavior is cooperative.        Thought Content: Thought content normal.        Cognition and Memory: Cognition normal.       09/02/2023    8:29 AM  6CIT Screen  What Year? 0 points  What month? 0 points  What time? 0 points  Count back from 20 0 points  Months in reverse 0 points  Repeat phrase 4 points  Total Score 4 points   Results for orders placed or performed in visit on 03/18/23  PSA Total (Reflex To Free)  Result Value Ref Range   Prostate Specific Ag, Serum 9.5 (H) 0.0 - 4.0 ng/mL   Reflex Criteria Comment   %fPSA Reflex  Result Value Ref Range   PSA, FREE 1.81 N/A ng/mL   % FREE PSA 19.1 %      Assessment & Plan:   Problem List Items Addressed This Visit       Cardiovascular and Mediastinum   Aortic atherosclerosis (HCC)    Chronic, stable.  Noted on CT imaging 02/12/21 -- educated patient on finding.  Recommend continue statin daily for prevention + daily Baby ASA 81 MG.        Relevant Medications   losartan (COZAAR) 50 MG tablet   atorvastatin (LIPITOR) 10 MG tablet   Other Relevant Orders   Comprehensive metabolic panel   Lipid Panel  w/o Chol/HDL Ratio   Essential hypertension    Chronic, above goal in office on initial and recheck.  Recommend he monitor BP at least a few mornings a week at home and document.  DASH diet at home.  Will increase Losartan to 50 MG, discussed with him he can finish out 25 MG by taking two daily and then can start the 50 MG (one pill) daily.  He was able to verbalize this back.  Educated him on BP goal and kidney disease.  Labs today: CBC, CMP, TSH, urine ALB.          Relevant Medications   losartan (COZAAR) 50 MG tablet   atorvastatin (LIPITOR) 10 MG tablet   Other Relevant Orders   CBC with Differential/Platelet   Comprehensive metabolic panel   TSH     Genitourinary   Stage 3a chronic kidney disease (HCC)    Chronic,ongoing.  Continue ARB for kidney function.  Recommend increase water intake at home + avoid Ibuprofen products + contrast unless absolutely needed.  CMP today.  Current kidney failure risk equation is 2.27%, if gets to 3-5% will refer to nephrology.      Relevant Orders   CBC with Differential/Platelet   Comprehensive metabolic panel   TSH   Microalbumin, Urine Waived     Other   Elevated PSA    Chronic, stable.  Being monitored by urology, will continue this collaboration.  Recent note and labs reviewed.      Hyperlipidemia  Chronic, ongoing.  Has significant family history with father and grandfather having strokes.  Continue daily statin at this time and adjust as needed.  Lipid panel today. Goal LDL <70 for this patient.        Relevant Medications   losartan (COZAAR) 50 MG tablet   atorvastatin (LIPITOR) 10 MG tablet   Other Relevant Orders   Comprehensive metabolic panel   Lipid Panel w/o Chol/HDL Ratio   Other Visit Diagnoses     Medicare annual wellness visit, subsequent    -  Primary   Medicare wellness due, performed with patient today.   Screening for colon cancer       GI referral placed.   Relevant Orders   Ambulatory referral to  Gastroenterology   Encounter for annual physical exam       Annual physical today with labs and health maintenance reviewed, discussed with patient.        Discussed aspirin prophylaxis for myocardial infarction prevention and decision was it was not indicated  LABORATORY TESTING:  Health maintenance labs ordered today as discussed above.   The natural history of prostate cancer and ongoing controversy regarding screening and potential treatment outcomes of prostate cancer has been discussed with the patient. The meaning of a false positive PSA and a false negative PSA has been discussed. He indicates understanding of the limitations of this screening test and wishes to proceed with screening PSA testing -- done with urology.   IMMUNIZATIONS:   - Tdap: Tetanus vaccination status reviewed: last tetanus booster within 10 years. - Influenza: Up to date - Pneumovax: PCV20 up to date - Prevnar: Up To Date - Zostavax vaccine: Up To Date - Covid vaccines - up to date  SCREENING: - Colonoscopy: Ordered referral today, discussed with patient Discussed with patient purpose of the colonoscopy is to detect colon cancer at curable precancerous or early stages   - AAA Screening: Not applicable  -Hearing Test: Not applicable  -Spirometry: Not applicable   PATIENT COUNSELING:    Sexuality: Discussed sexually transmitted diseases, partner selection, use of condoms, avoidance of unintended pregnancy  and contraceptive alternatives.   Advised to avoid cigarette smoking.  I discussed with the patient that most people either abstain from alcohol or drink within safe limits (<=14/week and <=4 drinks/occasion for males, <=7/weeks and <= 3 drinks/occasion for females) and that the risk for alcohol disorders and other health effects rises proportionally with the number of drinks per week and how often a drinker exceeds daily limits.  Discussed cessation/primary prevention of drug use and availability  of treatment for abuse.   Diet: Encouraged to adjust caloric intake to maintain  or achieve ideal body weight, to reduce intake of dietary saturated fat and total fat, to limit sodium intake by avoiding high sodium foods and not adding table salt, and to maintain adequate dietary potassium and calcium preferably from fresh fruits, vegetables, and low-fat dairy products.    Stressed the importance of regular exercise  Injury prevention: Discussed safety belts, safety helmets, smoke detector, smoking near bedding or upholstery.   Dental health: Discussed importance of regular tooth brushing, flossing, and dental visits.   Follow up plan: NEXT PREVENTATIVE PHYSICAL DUE IN 1 YEAR. Return in about 4 weeks (around 09/30/2023) for HTN -- increased Losartan to 50 MG.

## 2023-09-02 NOTE — Assessment & Plan Note (Signed)
Chronic, ongoing.  Has significant family history with father and grandfather having strokes.  Continue daily statin at this time and adjust as needed.  Lipid panel today. Goal LDL <70 for this patient.

## 2023-09-02 NOTE — Assessment & Plan Note (Signed)
Chronic, stable.  Noted on CT imaging 02/12/21 -- educated patient on finding.  Recommend continue statin daily for prevention + daily Baby ASA 81 MG.

## 2023-09-02 NOTE — Assessment & Plan Note (Signed)
Chronic,ongoing.  Continue ARB for kidney function.  Recommend increase water intake at home + avoid Ibuprofen products + contrast unless absolutely needed.  CMP today.  Current kidney failure risk equation is 2.27%, if gets to 3-5% will refer to nephrology.

## 2023-09-03 ENCOUNTER — Other Ambulatory Visit: Payer: Self-pay | Admitting: Nurse Practitioner

## 2023-09-03 LAB — LIPID PANEL W/O CHOL/HDL RATIO
Cholesterol, Total: 170 mg/dL (ref 100–199)
HDL: 61 mg/dL (ref >39)
LDL Chol Calc (NIH): 92 mg/dL (ref 0–99)
Triglycerides: 90 mg/dL (ref 0–149)
VLDL Cholesterol Cal: 17 mg/dL (ref 5–40)

## 2023-09-03 LAB — PSA TOTAL (REFLEX TO FREE): Prostate Specific Ag, Serum: 9.5 ng/mL — ABNORMAL HIGH (ref 0.0–4.0)

## 2023-09-03 LAB — CBC WITH DIFFERENTIAL/PLATELET
Basophils Absolute: 0 10*3/uL (ref 0.0–0.2)
Basos: 1 %
EOS (ABSOLUTE): 0.1 10*3/uL (ref 0.0–0.4)
Eos: 2 %
Hematocrit: 45.1 % (ref 37.5–51.0)
Hemoglobin: 15.5 g/dL (ref 13.0–17.7)
Immature Grans (Abs): 0 10*3/uL (ref 0.0–0.1)
Immature Granulocytes: 0 %
Lymphocytes Absolute: 1.4 10*3/uL (ref 0.7–3.1)
Lymphs: 39 %
MCH: 31.8 pg (ref 26.6–33.0)
MCHC: 34.4 g/dL (ref 31.5–35.7)
MCV: 92 fL (ref 79–97)
Monocytes Absolute: 0.3 10*3/uL (ref 0.1–0.9)
Monocytes: 9 %
Neutrophils Absolute: 1.8 10*3/uL (ref 1.4–7.0)
Neutrophils: 49 %
Platelets: 203 10*3/uL (ref 150–450)
RBC: 4.88 x10E6/uL (ref 4.14–5.80)
RDW: 12.4 % (ref 11.6–15.4)
WBC: 3.7 10*3/uL (ref 3.4–10.8)

## 2023-09-03 LAB — COMPREHENSIVE METABOLIC PANEL
ALT: 24 [IU]/L (ref 0–44)
AST: 19 [IU]/L (ref 0–40)
Albumin: 4.6 g/dL (ref 3.8–4.8)
Alkaline Phosphatase: 87 [IU]/L (ref 44–121)
BUN/Creatinine Ratio: 11 (ref 10–24)
BUN: 17 mg/dL (ref 8–27)
Bilirubin Total: 0.4 mg/dL (ref 0.0–1.2)
CO2: 22 mmol/L (ref 20–29)
Calcium: 9.6 mg/dL (ref 8.6–10.2)
Chloride: 106 mmol/L (ref 96–106)
Creatinine, Ser: 1.52 mg/dL — ABNORMAL HIGH (ref 0.76–1.27)
Globulin, Total: 2.4 g/dL (ref 1.5–4.5)
Glucose: 102 mg/dL — ABNORMAL HIGH (ref 70–99)
Potassium: 4.7 mmol/L (ref 3.5–5.2)
Sodium: 143 mmol/L (ref 134–144)
Total Protein: 7 g/dL (ref 6.0–8.5)
eGFR: 49 mL/min/{1.73_m2} — ABNORMAL LOW (ref >59)

## 2023-09-03 LAB — FPSA% REFLEX
% FREE PSA: 19.2 %
PSA, FREE: 1.82 ng/mL

## 2023-09-03 LAB — TSH: TSH: 1.46 u[IU]/mL (ref 0.450–4.500)

## 2023-09-03 MED ORDER — ATORVASTATIN CALCIUM 20 MG PO TABS: 20.0000 mg | ORAL_TABLET | Freq: Every day | ORAL | 4 refills | Status: DC

## 2023-09-03 NOTE — Progress Notes (Signed)
Contacted via MyChart   Good evening Nicholas Shah, your labs have returned: - Kidney function, creatinine and eGFR, continues to show Stage 3a kidney disease with no worsening and urine continues to show protein.  Please ensure to take Losartan 50 MG daily. - CBC shows no anemia or infection. - Thyroid lab normal. - Cholesterol labs show LDL above goal.  I am going to increase your Atorvastatin to 20 MG daily, stop the 10 MG tablet -- if some left you can finish them out by taking two daily.  We will recheck next visit.  If any issue with increase in dose let me know.   Keep being amazing!!  Thank you for allowing me to participate in your care.  I appreciate you. Kindest regards, Delisha Peaden

## 2023-09-10 ENCOUNTER — Telehealth: Payer: Self-pay | Admitting: Gastroenterology

## 2023-09-10 NOTE — Telephone Encounter (Signed)
Patient called back in to schedule his colonoscopy.

## 2023-09-11 ENCOUNTER — Other Ambulatory Visit: Payer: Self-pay

## 2023-09-11 ENCOUNTER — Telehealth: Payer: Self-pay

## 2023-09-11 DIAGNOSIS — Z1211 Encounter for screening for malignant neoplasm of colon: Secondary | ICD-10-CM

## 2023-09-11 MED ORDER — NA SULFATE-K SULFATE-MG SULF 17.5-3.13-1.6 GM/177ML PO SOLN: 1.0000 | Freq: Once | ORAL | 0 refills | Status: AC

## 2023-09-11 NOTE — Telephone Encounter (Signed)
Gastroenterology Pre-Procedure Review  Request Date: 10/08/23 Requesting Physician: Dr. Tobi Bastos  PATIENT REVIEW QUESTIONS: The patient responded to the following health history questions as indicated:    1. Are you having any GI issues? no 2. Do you have a personal history of Polyps? no 3. Do you have a family history of Colon Cancer or Polyps? no 4. Diabetes Mellitus? no 5. Joint replacements in the past 12 months?no 6. Major health problems in the past 3 months?no 7. Any artificial heart valves, MVP, or defibrillator?no    MEDICATIONS & ALLERGIES:    Patient reports the following regarding taking any anticoagulation/antiplatelet therapy:   Plavix, Coumadin, Eliquis, Xarelto, Lovenox, Pradaxa, Brilinta, or Effient? no Aspirin? no  Patient confirms/reports the following medications:  Current Outpatient Medications  Medication Sig Dispense Refill   atorvastatin (LIPITOR) 20 MG tablet Take 1 tablet (20 mg total) by mouth daily. 90 tablet 4   loratadine (CLARITIN) 10 MG tablet Take 1 tablet (10 mg total) by mouth daily. 90 tablet 4   losartan (COZAAR) 50 MG tablet Take 1 tablet (50 mg total) by mouth daily. 90 tablet 4   No current facility-administered medications for this visit.    Patient confirms/reports the following allergies:  No Known Allergies  No orders of the defined types were placed in this encounter.   AUTHORIZATION INFORMATION Primary Insurance: 1D#: Group #:  Secondary Insurance: 1D#: Group #:  SCHEDULE INFORMATION: Date: 10/08/23 Time: Location: [facility]

## 2023-09-14 NOTE — Telephone Encounter (Signed)
Patient called in to reschedule colonoscopy and he has questions about his procedure.

## 2023-09-15 ENCOUNTER — Encounter: Payer: Self-pay | Admitting: Urology

## 2023-09-15 ENCOUNTER — Ambulatory Visit: Payer: Medicare HMO | Admitting: Urology

## 2023-09-15 VITALS — BP 162/87 | HR 70 | Ht 67.8 in | Wt 171.0 lb

## 2023-09-15 DIAGNOSIS — R972 Elevated prostate specific antigen [PSA]: Secondary | ICD-10-CM | POA: Diagnosis not present

## 2023-09-15 DIAGNOSIS — N529 Male erectile dysfunction, unspecified: Secondary | ICD-10-CM

## 2023-09-15 NOTE — Progress Notes (Signed)
   09/15/2023 9:13 AM   Nicholas Shah 1951-02-22 [id number]  Reason for visit: Follow up elevated PSA, BPH, ED  HPI: 72 year old male previously followed by Dr. Evelene Croon who transferred his care to [place] in January 2024.  He underwent UroLift in 2019 for obstructive urinary symptoms and has had good results with minimal urinary symptoms since that time.  He has a long history of elevated PSA ranging from 4.8 in 2018 up to 10.8 in November 2023.  PSA has fluctuated primarily between 7.4 and 8.2 over the last 3 to 4 years.  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  He underwent an MRI fusion biopsy with Dr. Sheppard Penton at that time which showed inflammation but no evidence of malignancy.  PSA was 7.4 at that time.  Finally, he had a prostate IntelliScore suggesting a <10% chance of clinically significant prostate cancer.   Using shared decision making, he has opted to continue PSA monitoring.  Most recent PSA from November 2024 stable at 9.5(19.2% free) from 9.5 in May 2024.  Reassurance provided regarding prior negative workup and overall stability of PSA.  We reviewed the AUA guidelines that do not recommend routine screening in men over age 30, but with his good health and history of elevated PSA it is not unreasonable to monitor for another few years.  At this point I think we can safely space to yearly screening.  He also had some new concerns about ED today.  He was previously prescribed sildenafil, unknown dose, by Dr. Sheppard Penton.  He only tried this once.  We reviewed the maximum dose of sildenafil is 100 mg.  We discussed alternatives like Cialis.  He would like to try the sildenafil a few more times prior to changing to Cialis.  -With his history of negative MRI fusion biopsy and stable PSA, I think PSA monitoring alone is reasonable.  We discussed other options including prostate biopsy, repeat  prostate MRI, or 4K score.  Using shared decision making he was in agreement to repeat PSA in 1 year -Trial of sildenafil 100 mg on demand for ED, risk and benefits discussed.  If no improvement can trial Cialis 20 mg as needed -RTC 1 year PSA reflex to free prior, PVR  Sondra Come, MD  [facility] [address] [place], [place] [address] [phone]

## 2023-09-15 NOTE — Patient Instructions (Addendum)
Take sildenafil as needed 30 to 45 minutes prior to sexual activity.  You still need sexual stimulation to get an erection.  Side effects can include headache, changes in vision, or stuffy nose.  Take the sildenafil on an empty stomach.  The dose ranges from 20 mg to 100 mg as needed, and 100 mg is the maximum dose.  If you are not having any improvement on the sildenafil, please let us know and we can try Cialis which is a different type of erection medication that is slightly different from Sildenafil.  Maximum dose of Cialis is 20 mg daily.  Prostate Cancer Screening  Prostate cancer screening is testing that is done to check for the presence of prostate cancer in men. The prostate gland is a walnut-sized gland that is located below the bladder and in front of the rectum in males. The function of the prostate is to add fluid to semen during ejaculation. Prostate cancer is one of the most common types of cancer in men. Who should have prostate cancer screening? Screening recommendations vary based on age and other risk factors, as well as between the professional organizations who make the recommendations. In general, screening is recommended if: You are age 20 to 55 and have an average risk for prostate cancer. You should talk with your health care provider about your need for screening and how often screening should be done. Because most prostate cancers are slow growing and will not cause death, screening in this age group is generally reserved for men who have a 10- to 15-year life expectancy. You are younger than age 75, and you have these risk factors: Having a father, brother, or uncle who has been diagnosed with prostate cancer. The risk is higher if your family member's cancer occurred at an early age or if you have multiple family members with prostate cancer at an early age. Being a male who is [place] or is of [place] or sub-Saharan African descent. In general, screening is not  recommended if: You are younger than age 75. You are between the ages of 61 and 12 and you have no risk factors. You are 90 years of age or older. At this age, the risks that screening can cause are greater than the benefits that it may provide. If you are at high risk for prostate cancer, your health care provider may recommend that you have screenings more often or that you start screening at a younger age. How is screening for prostate cancer done? The recommended prostate cancer screening test is a blood test called the prostate-specific antigen (PSA) test. PSA is a protein that is made in the prostate. As you age, your prostate naturally produces more PSA. Abnormally high PSA levels may be caused by: Prostate cancer. An enlarged prostate that is not caused by cancer (benign prostatic hyperplasia, or BPH). This condition is very common in older men. A prostate gland infection (prostatitis) or urinary tract infection. Certain medicines such as male hormones (like testosterone) or other medicines that raise testosterone levels. A rectal exam may be done as part of prostate cancer screening to help provide information about the size of your prostate gland. When a rectal exam is performed, it should be done after the PSA level is drawn to avoid any effect on the results. Depending on the PSA results, you may need more tests, such as: A physical exam to check the size of your prostate gland, if not done as part of screening. Blood and imaging  tests. A procedure to remove tissue samples from your prostate gland for testing (biopsy). This is the only way to know for certain if you have prostate cancer. What are the benefits of prostate cancer screening? Screening can help to identify cancer at an early stage, before symptoms start and when the cancer can be treated more easily. There is a small chance that screening may lower your risk of dying from prostate cancer. The chance is small because  prostate cancer is a slow-growing cancer, and most men with prostate cancer die from a different cause. What are the risks of prostate cancer screening? The main risk of prostate cancer screening is diagnosing and treating prostate cancer that would never have caused any symptoms or problems. This is called overdiagnosisand overtreatment. PSA screening cannot tell you if your PSA is high due to cancer or a different cause. A prostate biopsy is the only procedure to diagnose prostate cancer. Even the results of a biopsy may not tell you if your cancer needs to be treated. Slow-growing prostate cancer may not need any treatment other than monitoring, so diagnosing and treating it may cause unnecessary stress or other side effects. Questions to ask your health care provider When should I start prostate cancer screening? What is my risk for prostate cancer? How often do I need screening? What type of screening tests do I need? How do I get my test results? What do my results mean? Do I need treatment? Where to find more information The American Cancer Society: www.cancer.org American Urological Association: www.auanet.org Contact a health care provider if: You have difficulty urinating. You have pain when you urinate or ejaculate. You have blood in your urine or semen. You have pain in your back or in the area of your prostate. Summary Prostate cancer is a common type of cancer in men. The prostate gland is located below the bladder and in front of the rectum. This gland adds fluid to semen during ejaculation. Prostate cancer screening may identify cancer at an early stage, when the cancer can be treated more easily and is less likely to have spread to other areas of the body. The prostate-specific antigen (PSA) test is the recommended screening test for prostate cancer, but it has associated risks. Discuss the risks and benefits of prostate cancer screening with your health care provider. If you  are age 20 or older, the risks that screening can cause are greater than the benefits that it may provide. This information is not intended to replace advice given to you by your health care provider. Make sure you discuss any questions you have with your health care provider. Document Revised: 04/01/2021 Document Reviewed: 04/01/2021 Elsevier Patient Education  2024 [employer].

## 2023-09-26 ENCOUNTER — Other Ambulatory Visit: Payer: Self-pay | Admitting: Nurse Practitioner

## 2023-09-27 NOTE — Patient Instructions (Signed)
Be Involved in Caring For Your Health:  Taking Medications When medications are taken as directed, they can greatly improve your health. But if they are not taken as prescribed, they may not work. In some cases, not taking them correctly can be harmful. To help ensure your treatment remains effective and safe, understand your medications and how to take them. Bring your medications to each visit for review by your provider.  Your lab results, notes, and after visit summary will be available on My Chart. We strongly encourage you to use this feature. If lab results are abnormal the clinic will contact you with the appropriate steps. If the clinic does not contact you assume the results are satisfactory. You can always view your results on My Chart. If you have questions regarding your health or results, please contact the clinic during office hours. You can also ask questions on My Chart.  We at [facility] are grateful that you chose us to provide your care. We strive to provide evidence-based and compassionate care and are always looking for feedback. If you get a survey from the clinic please complete this so we can hear your opinions.  DASH Eating Plan DASH stands for Dietary Approaches to Stop Hypertension. The DASH eating plan is a healthy eating plan that has been shown to: Lower high blood pressure (hypertension). Reduce your risk for type 2 diabetes, heart disease, and stroke. Help with weight loss. What are tips for following this plan? Reading food labels Check food labels for the amount of salt (sodium) per serving. Choose foods with less than 5 percent of the Daily Value (DV) of sodium. In general, foods with less than 300 milligrams (mg) of sodium per serving fit into this eating plan. To find whole grains, look for the word "whole" as the first word in the ingredient list. Shopping Buy products labeled as "low-sodium" or "no salt added." Buy fresh foods. Avoid canned  foods and pre-made or frozen meals. Cooking Try not to add salt when you cook. Use salt-free seasonings or herbs instead of table salt or sea salt. Check with your health care provider or pharmacist before using salt substitutes. Do not fry foods. Cook foods in healthy ways, such as baking, boiling, grilling, roasting, or broiling. Cook using oils that are good for your heart. These include olive, canola, avocado, soybean, and sunflower oil. Meal planning  Eat a balanced diet. This should include: 4 or more servings of fruits and 4 or more servings of vegetables each day. Try to fill half of your plate with fruits and vegetables. 6-8 servings of whole grains each day. 6 or less servings of lean meat, poultry, or fish each day. 1 oz is 1 serving. A 3 oz (85 g) serving of meat is about the same size as the palm of your hand. One egg is 1 oz (28 g). 2-3 servings of low-fat dairy each day. One serving is 1 cup (237 mL). 1 serving of nuts, seeds, or beans 5 times each week. 2-3 servings of heart-healthy fats. Healthy fats called omega-3 fatty acids are found in foods such as walnuts, flaxseeds, fortified milks, and eggs. These fats are also found in cold-water fish, such as sardines, salmon, and mackerel. Limit how much you eat of: Canned or prepackaged foods. Food that is high in trans fat, such as fried foods. Food that is high in saturated fat, such as fatty meat. Desserts and other sweets, sugary drinks, and other foods with added sugar. Full-fat   dairy products. Do not salt foods before eating. Do not eat more than 4 egg yolks a week. Try to eat at least 2 vegetarian meals a week. Eat more home-cooked food and less restaurant, buffet, and fast food. Lifestyle When eating at a restaurant, ask if your food can be made with less salt or no salt. If you drink alcohol: Limit how much you have to: 0-1 drink a day if you are male. 0-2 drinks a day if you are male. Know how much alcohol is in  your drink. In the [place] one drink is one 12 oz bottle of beer (355 mL), one 5 oz glass of wine (148 mL), or one 1 oz glass of hard liquor (44 mL). General information Avoid eating more than 2,300 mg of salt a day. If you have hypertension, you may need to reduce your sodium intake to 1,500 mg a day. Work with your provider to stay at a healthy body weight or lose weight. Ask what the best weight range is for you. On most days of the week, get at least 30 minutes of exercise that causes your heart to beat faster. This may include walking, swimming, or biking. Work with your provider or dietitian to adjust your eating plan to meet your specific calorie needs. What foods should I eat? Fruits All fresh, dried, or frozen fruit. Canned fruits that are in their natural juice and do not have sugar added to them. Vegetables Fresh or frozen vegetables that are raw, steamed, roasted, or grilled. Low-sodium or reduced-sodium tomato and vegetable juice. Low-sodium or reduced-sodium tomato sauce and tomato paste. Low-sodium or reduced-sodium canned vegetables. Grains Whole-grain or whole-wheat bread. Whole-grain or whole-wheat pasta. Brown rice. Oatmeal. Quinoa. Bulgur. Whole-grain and low-sodium cereals. Pita bread. Low-fat, low-sodium crackers. Whole-wheat flour tortillas. Meats and other proteins Skinless chicken or turkey. Ground chicken or turkey. Pork with fat trimmed off. Fish and seafood. Egg whites. Dried beans, peas, or lentils. Unsalted nuts, nut butters, and seeds. Unsalted canned beans. Lean cuts of beef with fat trimmed off. Low-sodium, lean precooked or cured meat, such as sausages or meat loaves. Dairy Low-fat (1%) or fat-free (skim) milk. Reduced-fat, low-fat, or fat-free cheeses. Nonfat, low-sodium ricotta or cottage cheese. Low-fat or nonfat yogurt. Low-fat, low-sodium cheese. Fats and oils Soft margarine without trans fats. Vegetable oil. Reduced-fat, low-fat, or light mayonnaise and salad  dressings (reduced-sodium). Canola, safflower, olive, avocado, soybean, and sunflower oils. Avocado. Seasonings and condiments Herbs. Spices. Seasoning mixes without salt. Other foods Unsalted popcorn and pretzels. Fat-free sweets. The items listed above may not be all the foods and drinks you can have. Talk to a dietitian to learn more. What foods should I avoid? Fruits Canned fruit in a light or heavy syrup. Fried fruit. Fruit in cream or butter sauce. Vegetables Creamed or fried vegetables. Vegetables in a cheese sauce. Regular canned vegetables that are not marked as low-sodium or reduced-sodium. Regular canned tomato sauce and paste that are not marked as low-sodium or reduced-sodium. Regular tomato and vegetable juices that are not marked as low-sodium or reduced-sodium. Pickles. Olives. Grains Baked goods made with fat, such as croissants, muffins, or some breads. Dry pasta or rice meal packs. Meats and other proteins Fatty cuts of meat. Ribs. Fried meat. Bacon. Bologna, salami, and other precooked or cured meats, such as sausages or meat loaves, that are not lean and low in sodium. Fat from the back of a pig (fatback). Bratwurst. Salted nuts and seeds. Canned beans with added salt. Canned   or smoked fish. Whole eggs or egg yolks. Chicken or turkey with skin. Dairy Whole or 2% milk, cream, and half-and-half. Whole or full-fat cream cheese. Whole-fat or sweetened yogurt. Full-fat cheese. Nondairy creamers. Whipped toppings. Processed cheese and cheese spreads. Fats and oils Butter. Stick margarine. Lard. Shortening. Ghee. Bacon fat. Tropical oils, such as coconut, palm kernel, or palm oil. Seasonings and condiments Onion salt, garlic salt, seasoned salt, table salt, and sea salt. Worcestershire sauce. Tartar sauce. Barbecue sauce. Teriyaki sauce. Soy sauce, including reduced-sodium soy sauce. Steak sauce. Canned and packaged gravies. Fish sauce. Oyster sauce. Cocktail sauce. Store-bought  horseradish. Ketchup. Mustard. Meat flavorings and tenderizers. Bouillon cubes. Hot sauces. Pre-made or packaged marinades. Pre-made or packaged taco seasonings. Relishes. Regular salad dressings. Other foods Salted popcorn and pretzels. The items listed above may not be all the foods and drinks you should avoid. Talk to a dietitian to learn more. Where to find more information National Heart, Lung, and Blood Institute (NHLBI): nhlbi.nih.gov American Heart Association (AHA): heart.org Academy of Nutrition and Dietetics: eatright.org National Kidney Foundation (NKF): kidney.org This information is not intended to replace advice given to you by your health care provider. Make sure you discuss any questions you have with your health care provider. Document Revised: 10/23/2022 Document Reviewed: 10/23/2022 Elsevier Patient Education  2024 Elsevier Inc.  

## 2023-09-28 NOTE — Telephone Encounter (Signed)
Discontinued Dose change Marjie Skiff, NP 09/02/23 9629   Requested Prescriptions  Refused Prescriptions Disp Refills   losartan (COZAAR) 25 MG tablet [Pharmacy Med Name: Losartan Potassium 25 MG Oral Tablet] 90 tablet 0    Sig: Take 1 tablet by mouth once daily for blood pressure     Cardiovascular:  Angiotensin Receptor Blockers Failed - 09/26/2023  6:41 AM      Failed - Cr in normal range and within 180 days    Creatinine  Date Value Ref Range Status  04/06/2014 1.37 (H) 0.60 - 1.30 mg/dL Final   Creatinine, Ser  Date Value Ref Range Status  09/02/2023 1.52 (H) 0.76 - 1.27 mg/dL Final         Failed - Last BP in normal range    BP Readings from Last 1 Encounters:  09/15/23 (!) 162/87         Passed - K in normal range and within 180 days    Potassium  Date Value Ref Range Status  09/02/2023 4.7 3.5 - 5.2 mmol/L Final  04/06/2014 3.8 3.5 - 5.1 mmol/L Final         Passed - Patient is not pregnant      Passed - Valid encounter within last 6 months    Recent Outpatient Visits           3 weeks ago Medicare annual wellness visit, subsequent   [place] [facility] [place], [place] T, NP   7 months ago Stage 3a chronic kidney disease (HCC)   [place] [facility] [place], Dorie Rank, NP   9 months ago Trigger middle finger of left hand   [place] [facility] [place], [place] T, NP   1 year ago Stage 3a chronic kidney disease (HCC)   [place] [facility] [place], Corrie Dandy T, NP   1 year ago Stage 3a chronic kidney disease (HCC)   [place] [facility] [place], Dorie Rank, NP       Future Appointments             In 3 days [place], Dorie Rank, NP [place] [facility]   In 11 months [place], Laurette Schimke, MD [facility] [place]

## 2023-10-01 ENCOUNTER — Ambulatory Visit (INDEPENDENT_AMBULATORY_CARE_PROVIDER_SITE_OTHER): Payer: Medicare HMO | Admitting: Nurse Practitioner

## 2023-10-01 ENCOUNTER — Other Ambulatory Visit: Payer: Self-pay | Admitting: Nurse Practitioner

## 2023-10-01 ENCOUNTER — Encounter: Payer: Self-pay | Admitting: Nurse Practitioner

## 2023-10-01 VITALS — BP 114/75 | HR 77 | Temp 97.8°F | Wt 173.2 lb

## 2023-10-01 DIAGNOSIS — I10 Essential (primary) hypertension: Secondary | ICD-10-CM

## 2023-10-01 NOTE — Assessment & Plan Note (Signed)
Chronic, BP much improved at this time.  Recommend he monitor BP at least a few mornings a week at home and document.  DASH diet at home.  Will continue Losartan 50 MG, this is offering more benefit + offers kidney protection.  He was able to verbalize this back.  Educated him on BP goal and kidney disease.  Labs today: BMP.

## 2023-10-01 NOTE — Progress Notes (Signed)
BP 114/75   Pulse 77   Temp 97.8 F (36.6 C) (Oral)   Wt 173 lb 3.2 oz (78.6 kg)   SpO2 98%   BMI 26.49 kg/m    Subjective:    Patient ID: Nicholas Shah, male    DOB: 05-Aug-1951, 72 y.o.   MRN: [id number]  HPI: Nicholas Shah is a 72 y.o. male  Chief Complaint  Patient presents with   Hypertension    Increased Losartan to 50 MG on 09/02/23   HYPERTENSION with Chronic Kidney Disease 3a Presents today for assessment after Losartan was increase to 50 MG on 09/02/23 due to ongoing elevation in BP levels. Hypertension status: stable  Satisfied with current treatment? yes Duration of hypertension: chronic BP monitoring frequency:  not checking BP range:  BP medication side effects:  no Medication compliance: good compliance Aspirin: no Recurrent headaches: no Visual changes: no Palpitations: no Dyspnea: no Chest pain: no Lower extremity edema: no Dizzy/lightheaded: no   Relevant past medical, surgical, family and social history reviewed and updated as indicated. Interim medical history since our last visit reviewed. Allergies and medications reviewed and updated.  Review of Systems  Constitutional:  Negative for activity change, diaphoresis, fatigue and fever.  Respiratory:  Negative for cough, chest tightness, shortness of breath and wheezing.   Cardiovascular:  Negative for chest pain, palpitations and leg swelling.  Gastrointestinal: Negative.   Neurological: Negative.   Psychiatric/Behavioral: Negative.      Per HPI unless specifically indicated above     Objective:    BP 114/75   Pulse 77   Temp 97.8 F (36.6 C) (Oral)   Wt 173 lb 3.2 oz (78.6 kg)   SpO2 98%   BMI 26.49 kg/m   Wt Readings from Last 3 Encounters:  10/01/23 173 lb 3.2 oz (78.6 kg)  09/15/23 171 lb (77.6 kg)  09/02/23 171 lb 6.4 oz (77.7 kg)    Physical Exam Vitals and nursing note reviewed.  Constitutional:      General: He is awake. He is not in acute distress.     Appearance: He is well-developed and well-groomed. He is not ill-appearing or toxic-appearing.  HENT:     Head: Normocephalic.     Right Ear: Hearing and external ear normal.     Left Ear: Hearing and external ear normal.  Eyes:     General: Lids are normal.     Extraocular Movements: Extraocular movements intact.     Conjunctiva/sclera: Conjunctivae normal.  Neck:     Thyroid: No thyromegaly.     Vascular: No carotid bruit.  Cardiovascular:     Rate and Rhythm: Normal rate and regular rhythm.     Heart sounds: Normal heart sounds. No murmur heard.    No gallop.  Pulmonary:     Effort: No accessory muscle usage or respiratory distress.     Breath sounds: Normal breath sounds.  Abdominal:     General: Bowel sounds are normal. There is no distension.     Palpations: Abdomen is soft.     Tenderness: There is no abdominal tenderness.  Musculoskeletal:     Cervical back: Full passive range of motion without pain.     Right lower leg: No edema.     Left lower leg: No edema.  Lymphadenopathy:     Cervical: No cervical adenopathy.  Skin:    General: Skin is warm.     Capillary Refill: Capillary refill takes less than 2 seconds.  Neurological:  Mental Status: He is alert and oriented to person, place, and time.     Deep Tendon Reflexes: Reflexes are normal and symmetric.     Reflex Scores:      Brachioradialis reflexes are 2+ on the right side and 2+ on the left side.      Patellar reflexes are 2+ on the right side and 2+ on the left side. Psychiatric:        Attention and Perception: Attention normal.        Mood and Affect: Mood normal.        Speech: Speech normal.        Behavior: Behavior normal. Behavior is cooperative.        Thought Content: Thought content normal.    Results for orders placed or performed in visit on 09/02/23  PSA Total (Reflex To Free)   Collection Time: 09/02/23 10:13 AM  Result Value Ref Range   Prostate Specific Ag, Serum 9.5 (H) 0.0 - 4.0  ng/mL   Reflex Criteria Comment   %fPSA Reflex   Collection Time: 09/02/23 10:13 AM  Result Value Ref Range   PSA, FREE 1.82 N/A ng/mL   % FREE PSA 19.2 %      Assessment & Plan:   Problem List Items Addressed This Visit       Cardiovascular and Mediastinum   Essential hypertension - Primary   Chronic, BP much improved at this time.  Recommend he monitor BP at least a few mornings a week at home and document.  DASH diet at home.  Will continue Losartan 50 MG, this is offering more benefit + offers kidney protection.  He was able to verbalize this back.  Educated him on BP goal and kidney disease.  Labs today: BMP.          Relevant Orders   Basic metabolic panel     Follow up plan: Return in about 5 months (around 02/29/2024) for HTN/HLD.

## 2023-10-02 LAB — BASIC METABOLIC PANEL
BUN/Creatinine Ratio: 11 (ref 10–24)
BUN: 19 mg/dL (ref 8–27)
CO2: 23 mmol/L (ref 20–29)
Calcium: 9.6 mg/dL (ref 8.6–10.2)
Chloride: 105 mmol/L (ref 96–106)
Creatinine, Ser: 1.75 mg/dL — ABNORMAL HIGH (ref 0.76–1.27)
Glucose: 100 mg/dL — ABNORMAL HIGH (ref 70–99)
Potassium: 4.5 mmol/L (ref 3.5–5.2)
Sodium: 143 mmol/L (ref 134–144)
eGFR: 41 mL/min/{1.73_m2} — ABNORMAL LOW (ref >59)

## 2023-10-02 NOTE — Progress Notes (Signed)
Contacted via MyChart   Good afternoon Geary, your labs have returned and overall remain at baseline for you.  Continues to show kidney disease present, but no worsening.  Overall stable labs.  Continue all current medication.  Any questions? Keep being amazing!!  Thank you for allowing me to participate in your care.  I appreciate you. Kindest regards, Rohan Juenger

## 2023-10-06 ENCOUNTER — Ambulatory Visit: Payer: Medicare HMO | Admitting: Urology

## 2023-10-08 ENCOUNTER — Ambulatory Visit: Payer: Medicare HMO | Admitting: Anesthesiology

## 2023-10-08 ENCOUNTER — Encounter: Payer: Self-pay | Admitting: Gastroenterology

## 2023-10-08 ENCOUNTER — Encounter
Admission: RE | Disposition: A | Discharge status: Home / Self Care | Payer: Self-pay | Source: Ambulatory Visit | Attending: Gastroenterology

## 2023-10-08 ENCOUNTER — Ambulatory Visit
Admission: RE | Admit: 2023-10-08 | Discharge: 2023-10-08 | Disposition: A | Discharge status: Home / Self Care | Payer: Medicare HMO | Source: Ambulatory Visit | Attending: Gastroenterology | Admitting: Gastroenterology

## 2023-10-08 DIAGNOSIS — Z79899 Other long term (current) drug therapy: Secondary | ICD-10-CM | POA: Insufficient documentation

## 2023-10-08 DIAGNOSIS — K635 Polyp of colon: Secondary | ICD-10-CM

## 2023-10-08 DIAGNOSIS — D122 Benign neoplasm of ascending colon: Secondary | ICD-10-CM | POA: Insufficient documentation

## 2023-10-08 DIAGNOSIS — K5730 Diverticulosis of large intestine without perforation or abscess without bleeding: Secondary | ICD-10-CM | POA: Diagnosis not present

## 2023-10-08 DIAGNOSIS — I10 Essential (primary) hypertension: Secondary | ICD-10-CM | POA: Insufficient documentation

## 2023-10-08 DIAGNOSIS — Z1211 Encounter for screening for malignant neoplasm of colon: Secondary | ICD-10-CM | POA: Diagnosis not present

## 2023-10-08 DIAGNOSIS — D125 Benign neoplasm of sigmoid colon: Secondary | ICD-10-CM | POA: Diagnosis not present

## 2023-10-08 DIAGNOSIS — D120 Benign neoplasm of cecum: Secondary | ICD-10-CM | POA: Diagnosis not present

## 2023-10-08 DIAGNOSIS — K219 Gastro-esophageal reflux disease without esophagitis: Secondary | ICD-10-CM | POA: Insufficient documentation

## 2023-10-08 DIAGNOSIS — D126 Benign neoplasm of colon, unspecified: Secondary | ICD-10-CM

## 2023-10-08 HISTORY — PX: POLYPECTOMY: SHX5525

## 2023-10-08 HISTORY — PX: COLONOSCOPY WITH PROPOFOL: SHX5780

## 2023-10-08 SURGERY — COLONOSCOPY WITH PROPOFOL
Anesthesia: General

## 2023-10-08 MED ORDER — LIDOCAINE HCL (PF) 2 % IJ SOLN
INTRAMUSCULAR | Status: AC
Start: 2023-10-08 — End: ?
  Filled 2023-10-08: qty 5

## 2023-10-08 MED ORDER — STERILE WATER FOR IRRIGATION IR SOLN
Status: DC | PRN
  Administered 2023-10-08: 120 mL

## 2023-10-08 MED ORDER — LIDOCAINE HCL (CARDIAC) PF 100 MG/5ML IV SOSY
PREFILLED_SYRINGE | INTRAVENOUS | Status: DC | PRN
  Administered 2023-10-08: 100 mg via INTRAVENOUS

## 2023-10-08 MED ORDER — PHENYLEPHRINE 80 MCG/ML (10ML) SYRINGE FOR IV PUSH (FOR BLOOD PRESSURE SUPPORT)
PREFILLED_SYRINGE | INTRAVENOUS | Status: AC
  Filled 2023-10-08: qty 10

## 2023-10-08 MED ORDER — PROPOFOL 10 MG/ML IV BOLUS
INTRAVENOUS | Status: AC
Start: 2023-10-08 — End: ?
  Filled 2023-10-08: qty 40

## 2023-10-08 MED ORDER — SODIUM CHLORIDE 0.9 % IV SOLN: INTRAVENOUS | Status: DC

## 2023-10-08 MED ORDER — PHENYLEPHRINE 80 MCG/ML (10ML) SYRINGE FOR IV PUSH (FOR BLOOD PRESSURE SUPPORT)
PREFILLED_SYRINGE | INTRAVENOUS | Status: AC
Start: 2023-10-08 — End: ?
  Filled 2023-10-08: qty 10

## 2023-10-08 MED ORDER — PROPOFOL 10 MG/ML IV BOLUS
INTRAVENOUS | Status: DC | PRN
  Administered 2023-10-08: 150 ug/kg/min via INTRAVENOUS
  Administered 2023-10-08: 100 mg via INTRAVENOUS

## 2023-10-08 MED ORDER — PHENYLEPHRINE 80 MCG/ML (10ML) SYRINGE FOR IV PUSH (FOR BLOOD PRESSURE SUPPORT)
PREFILLED_SYRINGE | INTRAVENOUS | Status: DC | PRN
  Administered 2023-10-08 (×2): 160 ug via INTRAVENOUS
  Administered 2023-10-08: 80 ug via INTRAVENOUS

## 2023-10-08 NOTE — Anesthesia Preprocedure Evaluation (Signed)
Anesthesia Evaluation  Patient identified by MRN, date of birth, ID band Patient awake    Reviewed: Allergy & Precautions, NPO status , Patient's Chart, lab work & pertinent test results  History of Anesthesia Complications Negative for: history of anesthetic complications  Airway Mallampati: III  TM Distance: <3 FB Neck ROM: full    Dental  (+) Chipped   Pulmonary neg pulmonary ROS, neg shortness of breath   Pulmonary exam normal        Cardiovascular hypertension, (-) angina negative cardio ROS Normal cardiovascular exam     Neuro/Psych negative neurological ROS  negative psych ROS   GI/Hepatic Neg liver ROS,GERD  Controlled,,  Endo/Other  negative endocrine ROS    Renal/GU Renal disease  negative genitourinary   Musculoskeletal   Abdominal   Peds  Hematology negative hematology ROS (+)   Anesthesia Other Findings Past Medical History: No date: Allergy No date: Arthritis No date: Hypertension  Past Surgical History: No date: APPENDECTOMY No date: COLONOSCOPY No date: COSMETIC SURGERY 05/11/2018: CYSTOSCOPY WITH INSERTION OF UROLIFT; N/A     Comment:  Procedure: CYSTOSCOPY WITH INSERTION OF UROLIFT;                Surgeon: Orson Ape, MD;  Location: [facility] ORS;                Service: Urology;  Laterality: N/A; 1974: FACIAL RECONSTRUCTION SURGERY     Comment:  WIRES 04/17/2016: KNEE ARTHROSCOPY; Right     Comment:  Procedure: ARTHROSCOPY KNEE, MEDIAL AND LATERAL PARTIAL               MENISECTOMIES;  Surgeon: Juanell Fairly, MD;  Location:               [facility] ORS;  Service: Orthopedics;  Laterality: Right; No date: MINOR CARPAL TUNNEL 07/19/2020: PROSTATE BIOPSY; N/A     Comment:  Procedure: PROSTATE BIOPSY Michell Heinrich;  Surgeon:               Orson Ape, MD;  Location: [facility] ORS;  Service:               Urology;  Laterality: N/A; No date: TRIGGER FINGER RELEASE      Reproductive/Obstetrics negative OB ROS                             Anesthesia Physical Anesthesia Plan  ASA: 2  Anesthesia Plan: General   Post-op Pain Management:    Induction: Intravenous  PONV Risk Score and Plan: Propofol infusion and TIVA  Airway Management Planned: Natural Airway and Nasal Cannula  Additional Equipment:   Intra-op Plan:   Post-operative Plan:   Informed Consent: I have reviewed the patients History and Physical, chart, labs and discussed the procedure including the risks, benefits and alternatives for the proposed anesthesia with the patient or authorized representative who has indicated his/her understanding and acceptance.     Dental Advisory Given  Plan Discussed with: Anesthesiologist, CRNA and Surgeon  Anesthesia Plan Comments: (Patient consented for risks of anesthesia including but not limited to:  - adverse reactions to medications - risk of airway placement if required - damage to eyes, teeth, lips or other oral mucosa - nerve damage due to positioning  - sore throat or hoarseness - Damage to heart, brain, nerves, lungs, other parts of body or loss of life  Patient voiced understanding and assent.)  Anesthesia Quick Evaluation

## 2023-10-08 NOTE — Transfer of Care (Signed)
Immediate Anesthesia Transfer of Care Note  Patient: [name]  Procedure(s) Performed: COLONOSCOPY WITH PROPOFOL POLYPECTOMY  Patient Location: Endoscopy Unit  Anesthesia Type:General  Level of Consciousness: awake and drowsy  Airway & Oxygen Therapy: Patient Spontanous Breathing  Post-op Assessment: Report given to RN and Post -op Vital signs reviewed and stable  Post vital signs: Reviewed and stable  Last Vitals:  Vitals Value Taken Time  BP 99/61 10/08/23 1129  Temp 36.3 C 10/08/23 1127  Pulse 85 10/08/23 1129  Resp 19 10/08/23 1129  SpO2 99 % 10/08/23 1129  Vitals shown include unfiled device data.  Last Pain:  Vitals:   10/08/23 1127  TempSrc: Temporal  PainSc: Asleep         Complications: No notable events documented.

## 2023-10-08 NOTE — Op Note (Signed)
[facility] Patient Name: Nicholas Shah Procedure Date: 10/08/2023 11:04 AM MRN: [id number] Account #: [id number] Date of Birth: 1951-09-23 Admit Type: Outpatient Age: 72 Room: [facility] ENDO ROOM 2 Gender: Male Note Status: Finalized Instrument Name: Nelda Marseille [id number] Procedure:             Colonoscopy Indications:           Screening for colorectal malignant neoplasm Providers:             Wyline Mood MD, MD Referring MD:          Dorie Rank. Harvest Dark (Referring MD) Medicines:             Monitored Anesthesia Care Complications:         No immediate complications. Procedure:             Pre-Anesthesia Assessment:                        - Prior to the procedure, a History and Physical was                         performed, and patient medications, allergies and                         sensitivities were reviewed. The patient's tolerance                         of previous anesthesia was reviewed.                        - The risks and benefits of the procedure and the                         sedation options and risks were discussed with the                         patient. All questions were answered and informed                         consent was obtained.                        - ASA Grade Assessment: II - A patient with mild                         systemic disease.                        After obtaining informed consent, the colonoscope was                         passed under direct vision. Throughout the procedure,                         the patient's blood pressure, pulse, and oxygen                         saturations were monitored continuously. The                         Colonoscope was introduced  through the anus and                         advanced to the the cecum, identified by the                         appendiceal orifice. The colonoscopy was performed                         with ease. The patient tolerated the procedure well.                          The quality of the bowel preparation was excellent.                         The ileocecal valve, appendiceal orifice, and rectum                         were photographed. Findings:      The perianal and digital rectal examinations were normal.      A 5 mm polyp was found in the cecum. The polyp was sessile. The polyp       was removed with a cold snare. Resection and retrieval were complete.      Six sessile polyps were found in the ascending colon. The polyps were 5       to 6 mm in size. These polyps were removed with a cold snare. Resection       and retrieval were complete.      Two sessile polyps were found in the sigmoid colon. The polyps were 4 to       5 mm in size. These polyps were removed with a cold snare. Resection and       retrieval were complete.      Multiple medium-mouthed diverticula were found in the sigmoid colon.      The exam was otherwise without abnormality on direct and retroflexion       views. Impression:            - One 5 mm polyp in the cecum, removed with a cold                         snare. Resected and retrieved.                        - Six 5 to 6 mm polyps in the ascending colon, removed                         with a cold snare. Resected and retrieved.                        - Two 4 to 5 mm polyps in the sigmoid colon, removed                         with a cold snare. Resected and retrieved.                        - Diverticulosis in the sigmoid colon.                        -  The examination was otherwise normal on direct and                         retroflexion views. Recommendation:        - Discharge patient to home (with escort).                        - Resume previous diet.                        - Continue present medications.                        - Await pathology results.                        - Repeat colonoscopy for surveillance based on                         pathology results. Procedure Code(s):     ---  Professional ---                        [phone], Colonoscopy, flexible; with removal of                         tumor(s), polyp(s), or other lesion(s) by snare                         technique Diagnosis Code(s):     --- Professional ---                        Z12.11, Encounter for screening for malignant neoplasm                         of colon                        D12.0, Benign neoplasm of cecum                        D12.2, Benign neoplasm of ascending colon                        D12.5, Benign neoplasm of sigmoid colon                        K57.30, Diverticulosis of large intestine without                         perforation or abscess without bleeding CPT copyright 2022 American Medical Association. All rights reserved. The codes documented in this report are preliminary and upon coder review may  be revised to meet current compliance requirements. Wyline Mood, MD Wyline Mood MD, MD 10/08/2023 11:26:27 AM This report has been signed electronically. Number of Addenda: 0 Note Initiated On: 10/08/2023 11:04 AM Scope Withdrawal Time: 0 hours 10 minutes 54 seconds  Total Procedure Duration: 0 hours 14 minutes 49 seconds  Estimated Blood Loss:  Estimated blood loss: none.      [facility]

## 2023-10-08 NOTE — H&P (Signed)
Wyline Mood, MD [address], [place], [place], [address] [address], [address], [place], [place], [address] Phone: [phone]  Fax: [phone]  Primary Care Physician:  Marjie Skiff, NP   Pre-Procedure History & Physical: HPI:  Nicholas Shah is a 72 y.o. male is here for an colonoscopy.   Past Medical History:  Diagnosis Date   Allergy    Arthritis    Hypertension     Past Surgical History:  Procedure Laterality Date   APPENDECTOMY     COLONOSCOPY     COSMETIC SURGERY     CYSTOSCOPY WITH INSERTION OF UROLIFT N/A 05/11/2018   Procedure: CYSTOSCOPY WITH INSERTION OF UROLIFT;  Surgeon: Orson Ape, MD;  Location: [facility];  Service: Urology;  Laterality: N/A;   FACIAL RECONSTRUCTION SURGERY  1974   WIRES   KNEE ARTHROSCOPY Right 04/17/2016   Procedure: ARTHROSCOPY KNEE, MEDIAL AND LATERAL PARTIAL MENISECTOMIES;  Surgeon: Juanell Fairly, MD;  Location: [facility];  Service: Orthopedics;  Laterality: Right;   MINOR CARPAL TUNNEL     PROSTATE BIOPSY N/A 07/19/2020   Procedure: PROSTATE BIOPSY Michell Heinrich;  Surgeon: Orson Ape, MD;  Location: [facility];  Service: Urology;  Laterality: N/A;   TRIGGER FINGER RELEASE      Prior to Admission medications   Medication Sig Start Date End Date Taking? Authorizing Provider  atorvastatin (LIPITOR) 20 MG tablet Take 1 tablet (20 mg total) by mouth daily. 09/03/23   Cannady, Corrie Dandy T, NP  loratadine (CLARITIN) 10 MG tablet Take 1 tablet (10 mg total) by mouth daily. 09/02/23   Cannady, Corrie Dandy T, NP  losartan (COZAAR) 50 MG tablet Take 1 tablet (50 mg total) by mouth daily. 09/02/23   Aura Dials T, NP    Allergies as of 09/11/2023   (No Known Allergies)    Family History  Problem Relation Age of Onset   CAD Father    Lung cancer Father    Heart failure Father    Diabetes Maternal Grandfather    Diabetes Paternal Grandmother    CAD Paternal Grandfather    Heart disease Paternal  Grandfather     Social History   Socioeconomic History   Marital status: Legally Separated    Spouse name: Not on file   Number of children: Not on file   Years of education: Not on file   Highest education level: Associate degree: occupational, Scientist, product/process development, or vocational program  Occupational History   Not on file  Tobacco Use   Smoking status: Never    Passive exposure: Never   Smokeless tobacco: Never  Vaping Use   Vaping status: Never Used  Substance and Sexual Activity   Alcohol use: No   Drug use: No   Sexual activity: Not Currently  Other Topics Concern   Not on file  Social History Narrative   Not on file   Social Drivers of Health   Financial Resource Strain: Low Risk  (09/27/2023)   Overall Financial Resource Strain (CARDIA)    Difficulty of Paying Living Expenses: Not hard at all  Food Insecurity: No Food Insecurity (09/27/2023)   Hunger Vital Sign    Worried About Running Out of Food in the Last Year: Never true    Ran Out of Food in the Last Year: Never true  Transportation Needs: No Transportation Needs (09/27/2023)   PRAPARE - Administrator, Civil Service (Medical): No    Lack of Transportation (Non-Medical): No  Physical Activity: Insufficiently Active (09/27/2023)   Exercise Vital Sign    Days of Exercise per Week: 3 days    Minutes of Exercise per Session: 20 min  Stress: No Stress Concern Present (09/27/2023)   [employer] of Occupational Health - Occupational Stress Questionnaire    Feeling of Stress : Only a little  Social Connections: Moderately Integrated (09/27/2023)   Social Connection and Isolation Panel [NHANES]    Frequency of Communication with Friends and Family: More than three times a week    Frequency of Social Gatherings with Friends and Family: Twice a week    Attends Religious Services: More than 4 times per year    Active Member of [employer] or Organizations: Yes    Attends Engineer, structural: More than 4  times per year    Marital Status: Divorced  Intimate Partner Violence: Not At Risk (09/02/2023)   Humiliation, Afraid, Rape, and Kick questionnaire    Fear of Current or Ex-Partner: No    Emotionally Abused: No    Physically Abused: No    Sexually Abused: No    Review of Systems: See HPI, otherwise negative ROS  Physical Exam: There were no vitals taken for this visit. General:   Alert,  pleasant and cooperative in NAD Head:  Normocephalic and atraumatic. Neck:  Supple; no masses or thyromegaly. Lungs:  Clear throughout to auscultation, normal respiratory effort.    Heart:  +S1, +S2, Regular rate and rhythm, No edema. Abdomen:  Soft, nontender and nondistended. Normal bowel sounds, without guarding, and without rebound.   Neurologic:  Alert and  oriented x4;  grossly normal neurologically.  Impression/Plan: Nicholas Shah is here for an colonoscopy to be performed for Screening colonoscopy average risk   Risks, benefits, limitations, and alternatives regarding  colonoscopy have been reviewed with the patient.  Questions have been answered.  All parties agreeable.   Wyline Mood, MD  10/08/2023, 10:19 AM

## 2023-10-08 NOTE — Anesthesia Postprocedure Evaluation (Signed)
Anesthesia Post Note  Patient: Nicholas Shah  Procedure(s) Performed: COLONOSCOPY WITH PROPOFOL POLYPECTOMY  Patient location during evaluation: Endoscopy Anesthesia Type: General Level of consciousness: awake and alert Pain management: pain level controlled Vital Signs Assessment: post-procedure vital signs reviewed and stable Respiratory status: spontaneous breathing, nonlabored ventilation, respiratory function stable and patient connected to nasal cannula oxygen Cardiovascular status: blood pressure returned to baseline and stable Postop Assessment: no apparent nausea or vomiting Anesthetic complications: no   No notable events documented.   Last Vitals:  Vitals:   10/08/23 1024 10/08/23 1127  BP: 136/85 99/61  Pulse: 85 72  Resp: 18 18  Temp: (!) 35.6 C (!) 36.3 C  SpO2: 99% 100%    Last Pain:  Vitals:   10/08/23 1147  TempSrc:   PainSc: 0-No pain                 Cleda Mccreedy Netty Sullivant

## 2023-10-09 ENCOUNTER — Encounter: Payer: Self-pay | Admitting: Gastroenterology

## 2023-10-09 LAB — SURGICAL PATHOLOGY

## 2023-10-12 ENCOUNTER — Encounter: Payer: Self-pay | Admitting: Gastroenterology

## 2024-02-27 NOTE — Patient Instructions (Signed)
 Be Involved in Caring For Your Health:  Taking Medications When medications are taken as directed, they can greatly improve your health. But if they are not taken as prescribed, they may not work. In some cases, not taking them correctly can be harmful. To help ensure your treatment remains effective and safe, understand your medications and how to take them. Bring your medications to each visit for review by your provider.  Your lab results, notes, and after visit summary will be available on My Chart. We strongly encourage you to use this feature. If lab results are abnormal the clinic will contact you with the appropriate steps. If the clinic does not contact you assume the results are satisfactory. You can always view your results on My Chart. If you have questions regarding your health or results, please contact the clinic during office hours. You can also ask questions on My Chart.  We at [facility] are grateful that you chose [place] to provide your care. We strive to provide evidence-based and compassionate care and are always looking for feedback. If you get a survey from the clinic please complete this so we can hear your opinions.  DASH Eating Plan DASH stands for Dietary Approaches to Stop Hypertension. The DASH eating plan is a healthy eating plan that has been shown to: Lower high blood pressure (hypertension). Reduce your risk for type 2 diabetes, heart disease, and stroke. Help with weight loss. What are tips for following this plan? Reading food labels Check food labels for the amount of salt (sodium) per serving. Choose foods with less than 5 percent of the Daily Value (DV) of sodium. In general, foods with less than 300 milligrams (mg) of sodium per serving fit into this eating plan. To find whole grains, look for the word "whole" as the first word in the ingredient list. Shopping Buy products labeled as "low-sodium" or "no salt added." Buy fresh foods. Avoid canned  foods and pre-made or frozen meals. Cooking Try not to add salt when you cook. Use salt-free seasonings or herbs instead of table salt or sea salt. Check with your health care provider or pharmacist before using salt substitutes. Do not fry foods. Cook foods in healthy ways, such as baking, boiling, grilling, roasting, or broiling. Cook using oils that are good for your heart. These include olive, canola, avocado, soybean, and sunflower oil. Meal planning  Eat a balanced diet. This should include: 4 or more servings of fruits and 4 or more servings of vegetables each day. Try to fill half of your plate with fruits and vegetables. 6-8 servings of whole grains each day. 6 or less servings of lean meat, poultry, or fish each day. 1 oz is 1 serving. A 3 oz (85 g) serving of meat is about the same size as the palm of your hand. One egg is 1 oz (28 g). 2-3 servings of low-fat dairy each day. One serving is 1 cup (237 mL). 1 serving of nuts, seeds, or beans 5 times each week. 2-3 servings of heart-healthy fats. Healthy fats called omega-3 fatty acids are found in foods such as walnuts, flaxseeds, fortified milks, and eggs. These fats are also found in cold-water fish, such as sardines, salmon, and mackerel. Limit how much you eat of: Canned or prepackaged foods. Food that is high in trans fat, such as fried foods. Food that is high in saturated fat, such as fatty meat. Desserts and other sweets, sugary drinks, and other foods with added sugar. Full-fat  dairy products. Do not salt foods before eating. Do not eat more than 4 egg yolks a week. Try to eat at least 2 vegetarian meals a week. Eat more home-cooked food and less restaurant, buffet, and fast food. Lifestyle When eating at a restaurant, ask if your food can be made with less salt or no salt. If you drink alcohol: Limit how much you have to: 0-1 drink a day if you are male. 0-2 drinks a day if you are male. Know how much alcohol is in  your drink. In the [place] one drink is one 12 oz bottle of beer (355 mL), one 5 oz glass of wine (148 mL), or one 1 oz glass of hard liquor (44 mL). General information Avoid eating more than 2,300 mg of salt a day. If you have hypertension, you may need to reduce your sodium intake to 1,500 mg a day. Work with your provider to stay at a healthy body weight or lose weight. Ask what the best weight range is for you. On most days of the week, get at least 30 minutes of exercise that causes your heart to beat faster. This may include walking, swimming, or biking. Work with your provider or dietitian to adjust your eating plan to meet your specific calorie needs. What foods should I eat? Fruits All fresh, dried, or frozen fruit. Canned fruits that are in their natural juice and do not have sugar added to them. Vegetables Fresh or frozen vegetables that are raw, steamed, roasted, or grilled. Low-sodium or reduced-sodium tomato and vegetable juice. Low-sodium or reduced-sodium tomato sauce and tomato paste. Low-sodium or reduced-sodium canned vegetables. Grains Whole-grain or whole-wheat bread. Whole-grain or whole-wheat pasta. Brown rice. Orpah Cobb. Bulgur. Whole-grain and low-sodium cereals. Pita bread. Low-fat, low-sodium crackers. Whole-wheat flour tortillas. Meats and other proteins Skinless chicken or [place]. Ground chicken or [place]. Pork with fat trimmed off. Fish and seafood. Egg whites. Dried beans, peas, or lentils. Unsalted nuts, nut butters, and seeds. Unsalted canned beans. Lean cuts of beef with fat trimmed off. Low-sodium, lean precooked or cured meat, such as sausages or meat loaves. Dairy Low-fat (1%) or fat-free (skim) milk. Reduced-fat, low-fat, or fat-free cheeses. Nonfat, low-sodium ricotta or cottage cheese. Low-fat or nonfat yogurt. Low-fat, low-sodium cheese. Fats and oils Soft margarine without trans fats. Vegetable oil. Reduced-fat, low-fat, or light mayonnaise and salad  dressings (reduced-sodium). Canola, safflower, olive, avocado, soybean, and sunflower oils. Avocado. Seasonings and condiments Herbs. Spices. Seasoning mixes without salt. Other foods Unsalted popcorn and pretzels. Fat-free sweets. The items listed above may not be all the foods and drinks you can have. Talk to a dietitian to learn more. What foods should I avoid? Fruits Canned fruit in a light or heavy syrup. Fried fruit. Fruit in cream or butter sauce. Vegetables Creamed or fried vegetables. Vegetables in a cheese sauce. Regular canned vegetables that are not marked as low-sodium or reduced-sodium. Regular canned tomato sauce and paste that are not marked as low-sodium or reduced-sodium. Regular tomato and vegetable juices that are not marked as low-sodium or reduced-sodium. Rosita Fire. Olives. Grains Baked goods made with fat, such as croissants, muffins, or some breads. Dry pasta or rice meal packs. Meats and other proteins Fatty cuts of meat. Ribs. Fried meat. Tomasa Blase. Bologna, salami, and other precooked or cured meats, such as sausages or meat loaves, that are not lean and low in sodium. Fat from the back of a pig (fatback). Bratwurst. Salted nuts and seeds. Canned beans with added salt. Canned  or smoked fish. Whole eggs or egg yolks. Chicken or [place] with skin. Dairy Whole or 2% milk, cream, and half-and-half. Whole or full-fat cream cheese. Whole-fat or sweetened yogurt. Full-fat cheese. Nondairy creamers. Whipped toppings. Processed cheese and cheese spreads. Fats and oils Butter. Stick margarine. Lard. Shortening. Ghee. Bacon fat. Tropical oils, such as coconut, palm kernel, or palm oil. Seasonings and condiments Onion salt, garlic salt, seasoned salt, table salt, and sea salt. Worcestershire sauce. Tartar sauce. Barbecue sauce. Teriyaki sauce. Soy sauce, including reduced-sodium soy sauce. Steak sauce. Canned and packaged gravies. Fish sauce. Oyster sauce. Cocktail sauce. Store-bought  horseradish. Ketchup. Mustard. Meat flavorings and tenderizers. Bouillon cubes. Hot sauces. Pre-made or packaged marinades. Pre-made or packaged taco seasonings. Relishes. Regular salad dressings. Other foods Salted popcorn and pretzels. The items listed above may not be all the foods and drinks you should avoid. Talk to a dietitian to learn more. Where to find more information National Heart, Lung, and Blood Institute (NHLBI): BuffaloDryCleaner.gl American Heart Association (AHA): heart.org Academy of Nutrition and Dietetics: eatright.org National Kidney Foundation (NKF): kidney.org This information is not intended to replace advice given to you by your health care provider. Make sure you discuss any questions you have with your health care provider. Document Revised: 10/23/2022 Document Reviewed: 10/23/2022 Elsevier Patient Education  2024 [employer].

## 2024-02-29 ENCOUNTER — Encounter: Payer: Self-pay | Admitting: Nurse Practitioner

## 2024-02-29 ENCOUNTER — Ambulatory Visit: Payer: Self-pay | Admitting: Nurse Practitioner

## 2024-02-29 VITALS — BP 130/76 | HR 77 | Temp 98.0°F | Ht 67.5 in | Wt 173.0 lb

## 2024-02-29 DIAGNOSIS — I10 Essential (primary) hypertension: Secondary | ICD-10-CM

## 2024-02-29 DIAGNOSIS — E782 Mixed hyperlipidemia: Secondary | ICD-10-CM | POA: Diagnosis not present

## 2024-02-29 DIAGNOSIS — N1831 Chronic kidney disease, stage 3a: Secondary | ICD-10-CM

## 2024-02-29 DIAGNOSIS — R972 Elevated prostate specific antigen [PSA]: Secondary | ICD-10-CM

## 2024-02-29 DIAGNOSIS — I700 Atherosclerosis of aorta: Secondary | ICD-10-CM

## 2024-02-29 LAB — MICROALBUMIN, URINE WAIVED
Creatinine, Urine Waived: 200 mg/dL (ref 10–300)
Microalb, Ur Waived: 150 mg/L — ABNORMAL HIGH (ref 0–19)

## 2024-02-29 NOTE — Assessment & Plan Note (Signed)
 Chronic, stable.  Imaging on 02/12/21 -- educated patient on finding.  Recommend continue statin daily for prevention + daily Baby ASA 81 MG.

## 2024-02-29 NOTE — Assessment & Plan Note (Signed)
 Chronic and stable.  BP at goal today.  Recommend he monitor BP at least a few mornings a week at home and document.  DASH diet at home.  Will continue Losartan  50 MG, this is offering benefit + offers kidney protection.  He was able to verbalize this back.  Educated him on BP goal and kidney disease.  Labs today: BMP.

## 2024-02-29 NOTE — Assessment & Plan Note (Signed)
 Chronic, stable.  Being monitored by urology, will continue this collaboration.  Recent note and labs reviewed.

## 2024-02-29 NOTE — Assessment & Plan Note (Signed)
 Chronic, ongoing.  Has significant family history with father and grandfather having strokes.  Continue daily statin at this time and adjust as needed.  Lipid panel today. Goal LDL <70 for this patient.

## 2024-02-29 NOTE — Progress Notes (Signed)
 BP 130/76 (BP Location: Left Arm, Patient Position: Sitting, Cuff Size: Normal)   Pulse 77   Temp 98 F (36.7 C) (Oral)   Ht 5' 7.5" (1.715 m)   Wt 173 lb (78.5 kg)   SpO2 95%   BMI 26.70 kg/m    Subjective:    Patient ID: Nicholas Shah, male    DOB: December 03, 1950, 73 y.o.   MRN: [id number]  HPI: Nicholas Shah is a 72 y.o. male  Chief Complaint  Patient presents with   Hyperlipidemia   Hypertension   HYPERTENSION / HYPERLIPIDEMIA Taking Losartan  50 MG and Atorvastatin  10 MG. Aortic atherosclerosis on imaging 02/12/21.  Satisfied with current treatment? yes Duration of hypertension: chronic BP monitoring frequency: not checking BP range:  BP medication side effects: no Duration of hyperlipidemia: chronic Aspirin : no Recent stressors: no Recurrent headaches: no Visual changes: no Palpitations: no Dyspnea: no Chest pain: no Lower extremity edema: no Dizzy/lightheaded: no  The 10-year ASCVD risk score (Arnett DK, et al., 2019) is: 18.8%   Values used to calculate the score:     Age: 32 years     Sex: Male     Is Non-Hispanic African American: Yes     Diabetic: No     Tobacco smoker: No     Systolic Blood Pressure: 130 mmHg     Is BP treated: Yes     HDL Cholesterol: 61 mg/dL     Total Cholesterol: 170 mg/dL    CHRONIC KIDNEY DISEASE CKD status: stable Medications renally dose: yes Previous renal evaluation: no Pneumovax:  Up to Date Influenza Vaccine:  Up to Date   ELEVATED PSA: Following with Dr. Estanislao Heimlich.  Last visit on 09/15/23.  PSA remains a little elevated, they are monitoring with plan to recheck in 6 months.  Was given options to further assess.  Has history of biopsies, last one year ago per patient report -- has had x 2 - these were negative.  Family history of prostate cancer in father and grandfather.   Relevant past medical, surgical, family and social history reviewed and updated as indicated. Interim medical history since our last visit  reviewed. Allergies and medications reviewed and updated.  Review of Systems  Constitutional:  Negative for activity change, diaphoresis, fatigue and fever.  Respiratory:  Negative for cough, chest tightness, shortness of breath and wheezing.   Cardiovascular:  Negative for chest pain, palpitations and leg swelling.  Gastrointestinal: Negative.   Musculoskeletal: Negative.   Skin: Negative.   Neurological: Negative.   Psychiatric/Behavioral: Negative.      Per HPI unless specifically indicated above     Objective:    BP 130/76 (BP Location: Left Arm, Patient Position: Sitting, Cuff Size: Normal)   Pulse 77   Temp 98 F (36.7 C) (Oral)   Ht 5' 7.5" (1.715 m)   Wt 173 lb (78.5 kg)   SpO2 95%   BMI 26.70 kg/m   Wt Readings from Last 3 Encounters:  02/29/24 173 lb (78.5 kg)  10/08/23 165 lb 9.6 oz (75.1 kg)  10/01/23 173 lb 3.2 oz (78.6 kg)    Physical Exam Vitals and nursing note reviewed.  Constitutional:      General: He is awake. He is not in acute distress.    Appearance: He is well-developed and well-groomed. He is not ill-appearing.  HENT:     Head: Normocephalic and atraumatic.     Right Ear: Hearing normal. No drainage.     Left Ear: Hearing  normal. No drainage.  Eyes:     General: Lids are normal.        Right eye: No discharge.        Left eye: No discharge.     Conjunctiva/sclera: Conjunctivae normal.     Pupils: Pupils are equal, round, and reactive to light.  Neck:     Thyroid: No thyromegaly.     Vascular: No carotid bruit.     Trachea: Trachea normal.  Cardiovascular:     Rate and Rhythm: Normal rate and regular rhythm.     Heart sounds: Normal heart sounds, S1 normal and S2 normal. No murmur heard.    No gallop.  Pulmonary:     Effort: Pulmonary effort is normal. No accessory muscle usage or respiratory distress.     Breath sounds: Normal breath sounds.  Abdominal:     General: Bowel sounds are normal.     Palpations: Abdomen is soft.   Musculoskeletal:        General: Normal range of motion.     Cervical back: Normal range of motion and neck supple.     Right lower leg: No edema.     Left lower leg: No edema.  Skin:    General: Skin is warm and dry.     Capillary Refill: Capillary refill takes less than 2 seconds.     Findings: No rash.  Neurological:     Mental Status: He is alert and oriented to person, place, and time.  Psychiatric:        Attention and Perception: Attention normal.        Mood and Affect: Mood normal.        Speech: Speech normal.        Behavior: Behavior normal. Behavior is cooperative.        Thought Content: Thought content normal.    Results for orders placed or performed during the hospital encounter of 10/08/23  Surgical pathology   Collection Time: 10/08/23 12:00 AM  Result Value Ref Range   SURGICAL PATHOLOGY      SURGICAL PATHOLOGY [facility] [address], [address] [place], [place] [address] Telephone [phone] or [phone] Fax [phone]  REPORT OF SURGICAL PATHOLOGY   Accession #: [id number] Patient Name: Nicholas Shah, Nicholas Shah Visit # : [id number]  MRN: [id number] Physician: Luke Salaam DOB/Age Apr 04, 1951 (Age: 75) Gender: M Collected Date: 10/08/2023 Received Date: 10/08/2023  FINAL DIAGNOSIS       1. Colon, polyp(s), x1 cecum, x6 ascending, cold snare :       TUBULAR ADENOMA, 8 FRAGMENTS      NEGATIVE FOR HIGH-GRADE DYSPLASIA AND CARCINOMA       2. Sigmoid  Colon Polyp, x2 cold snare :       HYPERPLASTIC POLYPS      NEGATIVE FOR DYSPLASIA AND CARCINOMA       ELECTRONIC SIGNATURE : Picklesimer Md, Fred , Sports administrator, International aid/development worker  MICROSCOPIC DESCRIPTION  CASE COMMENTS STAINS USED IN DIAGNOSIS: H&E H&E    CLINICAL HISTORY  SPECIMEN(S) OBTAINED 1. Colon, polyp(s), X1 Cecum, X6 Ascending, Cold Snare 2. Sigmoid   Colon Polyp, X2 Cold Snare  SPECIMEN COMMENTS: SPECIMEN CLINICAL INFORMATION: 1. Screening  colonoscopy, colon polyps    Gross Description 1. "Cold snare ascending and cecal colon polyps x7", received in formalin is a 1.8 x 0.9 x 0.1 cm aggregate of multiple tan-gray tissue fragments.The specimen is filtered and submitted in toto in 1 block (1A). 2. "Cold snare sigmoid  colon polyps x2", received in formalin is a 1.0 x 1.0 x 0.1 cm aggregate of multiple tan-pink tissue fragments.The specimen is filtered and submitted in toto in 1 block (2A).      [facility] 10/08/2023        Report signed out from the following location(s) [facility] [address] [place], [place] [address] CLIA #: [id number]  [facility] [address] [place], [place] [address] CLIA #: [id number]       Assessment & Plan:   Problem List Items Addressed This Visit       Cardiovascular and Mediastinum   Essential hypertension   Chronic and stable.  BP at goal today.  Recommend he monitor BP at least a few mornings a week at home and document.  DASH diet at home.  Will continue Losartan  50 MG, this is offering benefit + offers kidney protection.  He was able to verbalize this back.  Educated him on BP goal and kidney disease.  Labs today: BMP.          Relevant Orders   Basic metabolic panel with GFR   Aortic atherosclerosis (HCC)   Chronic, stable.  Imaging on 02/12/21 -- educated patient on finding.  Recommend continue statin daily for prevention + daily Baby ASA 81 MG.          Genitourinary   Stage 3a chronic kidney disease (HCC) - Primary   Chronic,ongoing.  Continue ARB for kidney function.  Recommend increase water  intake at home + avoid Ibuprofen products + contrast unless absolutely needed.  BMP today.  Current kidney failure risk equation is 6.16%, recommendation to send to nephrology if 3-5%.  Plan on recheck today and if ongoing elevation will discuss with patient.      Relevant Orders   Basic metabolic panel with GFR   Microalbumin, Urine Waived      Other   Hyperlipidemia   Chronic, ongoing.  Has significant family history with father and grandfather having strokes.  Continue daily statin at this time and adjust as needed.  Lipid panel today. Goal LDL <70 for this patient.        Relevant Orders   Lipid Panel w/o Chol/HDL Ratio   Elevated PSA   Chronic, stable.  Being monitored by urology, will continue this collaboration.  Recent note and labs reviewed.        Follow up plan: Return in about 6 months (around 08/31/2024) for Annual Physical after 09/01/24.

## 2024-02-29 NOTE — Assessment & Plan Note (Signed)
 Chronic,ongoing.  Continue ARB for kidney function.  Recommend increase water  intake at home + avoid Ibuprofen products + contrast unless absolutely needed.  BMP today.  Current kidney failure risk equation is 6.16%, recommendation to send to nephrology if 3-5%.  Plan on recheck today and if ongoing elevation will discuss with patient.

## 2024-03-01 ENCOUNTER — Ambulatory Visit: Payer: Self-pay | Admitting: Nurse Practitioner

## 2024-03-01 LAB — BASIC METABOLIC PANEL WITH GFR
BUN/Creatinine Ratio: 13 (ref 10–24)
BUN: 21 mg/dL (ref 8–27)
CO2: 23 mmol/L (ref 20–29)
Calcium: 9.8 mg/dL (ref 8.6–10.2)
Chloride: 105 mmol/L (ref 96–106)
Creatinine, Ser: 1.65 mg/dL — ABNORMAL HIGH (ref 0.76–1.27)
Glucose: 97 mg/dL (ref 70–99)
Potassium: 5 mmol/L (ref 3.5–5.2)
Sodium: 141 mmol/L (ref 134–144)
eGFR: 44 mL/min/{1.73_m2} — ABNORMAL LOW (ref >59)

## 2024-03-01 LAB — LIPID PANEL W/O CHOL/HDL RATIO
Cholesterol, Total: 154 mg/dL (ref 100–199)
HDL: 62 mg/dL (ref >39)
LDL Chol Calc (NIH): 78 mg/dL (ref 0–99)
Triglycerides: 71 mg/dL (ref 0–149)
VLDL Cholesterol Cal: 14 mg/dL (ref 5–40)

## 2024-03-01 NOTE — Progress Notes (Signed)
 Contacted via MyChart   Good morning Nicholas Shah, your labs have returned.  Kidney function continues to show Chronic Kidney Disease Stage 3a to 3b range.  No changes in this.  You continue to pass protein in urine too and we will maintain you on Losartan  for this, offers kidney protection. Lipid panel remains stable.  Any questions? Keep being amazing!!  Thank you for allowing me to participate in your care.  I appreciate you. Kindest regards, Neetu Carrozza

## 2024-05-03 NOTE — Progress Notes (Unsigned)
 05/04/2024 4:11 PM   Burnis JAYSON Gin Aug 06, 1951 [id number]  Referring provider: Valerio Melanie DASEN, NP [address] [place],  [place] [address]  Urological history: 1. BPH with LU TS - UroLift (2019)   2. ED - sildenafil 100 mg, on-demand-dosing  3. Elevated PSA - PSA (2023) 10.8 - prostate MRI (2021) 65 g gland and possible PI-RADS 4 lesions  - fusion bx (2021) inflammation but no evidence of malignancy - prostate IntelliScore suggesting a <10% chance of clinically significant prostate cancer   Chief Complaint  Patient presents with   Follow-up   HPI: DAMETRIUS SANJUAN is a 73 y.o. man who presents today for discussion of ED meds.   Previous records reviewed.  He has been having issues with maintaining erections.  He states he is not taking any PDE 5 inhibitors recently.  He states that he was prescribed some PDE 5 inhibitors, but he did not use them because he did not feel he needed them at the time.  He denies any pain with erections.  He denies any curvature with erections.  He is dating right now and feels that his sex life is more spontaneous versus planned.   Lab Results  Component Value Date   CREATININE 1.65 (H) 02/29/2024   Lipid Panel     Component Value Date/Time   CHOL 154 02/29/2024 0842   TRIG 71 02/29/2024 0842   HDL 62 02/29/2024 0842   CHOLHDL 2.5 05/06/2021 1638   LDLCALC 78 02/29/2024 0842   LABVLDL 14 02/29/2024 0842     PMH: Past Medical History:  Diagnosis Date   Allergy    Arthritis    Hypertension     Surgical History: Past Surgical History:  Procedure Laterality Date   APPENDECTOMY     COLONOSCOPY     COLONOSCOPY WITH PROPOFOL  N/A 10/08/2023   Procedure: COLONOSCOPY WITH PROPOFOL ;  Surgeon: Therisa Bi, MD;  Location: [facility] ENDOSCOPY;  Service: Gastroenterology;  Laterality: N/A;   COSMETIC SURGERY     CYSTOSCOPY WITH INSERTION OF UROLIFT N/A 05/11/2018   Procedure: CYSTOSCOPY WITH INSERTION OF UROLIFT;  Surgeon: Kassie Ozell SAUNDERS, MD;  Location: [facility] ORS;  Service: Urology;  Laterality: N/A;   FACIAL RECONSTRUCTION SURGERY  1974   WIRES   KNEE ARTHROSCOPY Right 04/17/2016   Procedure: ARTHROSCOPY KNEE, MEDIAL AND LATERAL PARTIAL MENISECTOMIES;  Surgeon: Franky Cranker, MD;  Location: [facility] ORS;  Service: Orthopedics;  Laterality: Right;   MINOR CARPAL TUNNEL     POLYPECTOMY  10/08/2023   Procedure: POLYPECTOMY;  Surgeon: Therisa Bi, MD;  Location: [facility] ENDOSCOPY;  Service: Gastroenterology;;   PROSTATE BIOPSY N/A 07/19/2020   Procedure: PROSTATE BIOPSY GRAYCE LIGHT;  Surgeon: Kassie Ozell SAUNDERS, MD;  Location: [facility] ORS;  Service: Urology;  Laterality: N/A;   TRIGGER FINGER RELEASE      Home Medications:  Allergies as of 05/04/2024   No Known Allergies      Medication List        Accurate as of May 04, 2024  4:11 PM. If you have any questions, ask your nurse or doctor.          atorvastatin  20 MG tablet Commonly known as: LIPITOR Take 1 tablet (20 mg total) by mouth daily.   loratadine  10 MG tablet Commonly known as: Claritin  Take 1 tablet (10 mg total) by mouth daily.   losartan  50 MG tablet Commonly known as: COZAAR  Take 1 tablet (50 mg total) by mouth daily.   tadalafil  5  MG tablet Commonly known as: CIALIS  Take 1 tablet (5 mg total) by mouth daily.   tadalafil  20 MG tablet Commonly known as: CIALIS  Take 1 tablet (20 mg total) by mouth daily as needed for erectile dysfunction.        Allergies: No Known Allergies  Family History: Family History  Problem Relation Age of Onset   CAD Father    Lung cancer Father    Heart failure Father    Diabetes Maternal Grandfather    Diabetes Paternal Grandmother    CAD Paternal Grandfather    Heart disease Paternal Grandfather     Social History: See HPI for pertinent social history  ROS: Pertinent ROS in HPI  Physical Exam: BP 122/73   Pulse 85   Ht 5' 8 (1.727 m)   Wt 172 lb (78 kg)   BMI 26.15 kg/m    Constitutional:  Well nourished. Alert and oriented, No acute distress. HEENT: [place] AT, moist mucus membranes.  Trachea midline Cardiovascular: No clubbing, cyanosis, or edema. Respiratory: Normal respiratory effort, no increased work of breathing. Neurologic: Grossly intact, no focal deficits, moving all 4 extremities. Psychiatric: Normal mood and affect.  Laboratory Data: See EPIC and HPI  I have reviewed the labs.   Pertinent Imaging: N/A  Assessment & Plan:    1. ED - We discussed taking tadalafil  5 mg daily and then boosting it with the 20 mg tadalafil  on days he feels he is going to have sexual intercourse or ICI - Since he is mostly nave to PDE 5 inhibitors, we will go ahead and start with the tadalafil  5 mg daily on the 20 mg boost, confirmed he is not taking nitrates - He will notify me via MyChart in 2 weeks on his progress, if he does not feel he is making headway's, he would like to switch to ICI  2. BPH with LU TS  3. Elevated PSA - follow up with Dr. Francisca in November  Return for Patient to call.  These notes generated with voice recognition software. I apologize for typographical errors.  CLOTILDA HELON RIGGERS  [facility] [address]  [address] [place], [place] [address] [phone]

## 2024-05-04 ENCOUNTER — Encounter: Payer: Self-pay | Admitting: Urology

## 2024-05-04 ENCOUNTER — Ambulatory Visit: Admitting: Urology

## 2024-05-04 VITALS — BP 122/73 | HR 85 | Ht 68.0 in | Wt 172.0 lb

## 2024-05-04 DIAGNOSIS — N401 Enlarged prostate with lower urinary tract symptoms: Secondary | ICD-10-CM

## 2024-05-04 DIAGNOSIS — N138 Other obstructive and reflux uropathy: Secondary | ICD-10-CM | POA: Diagnosis not present

## 2024-05-04 DIAGNOSIS — R972 Elevated prostate specific antigen [PSA]: Secondary | ICD-10-CM

## 2024-05-04 DIAGNOSIS — N529 Male erectile dysfunction, unspecified: Secondary | ICD-10-CM | POA: Diagnosis not present

## 2024-05-04 MED ORDER — TADALAFIL 20 MG PO TABS: 20.0000 mg | ORAL_TABLET | Freq: Every day | ORAL | 3 refills | Status: AC | PRN

## 2024-05-04 MED ORDER — TADALAFIL 5 MG PO TABS: 5.0000 mg | ORAL_TABLET | Freq: Every day | ORAL | 11 refills | Status: AC

## 2024-05-17 ENCOUNTER — Encounter: Payer: Self-pay | Admitting: Urology

## 2024-08-11 ENCOUNTER — Encounter: Payer: Self-pay | Admitting: Urology

## 2024-09-01 DIAGNOSIS — H524 Presbyopia: Secondary | ICD-10-CM | POA: Diagnosis not present

## 2024-09-03 NOTE — Patient Instructions (Signed)
 Be Involved in Caring For Your Health:  Taking Medications When medications are taken as directed, they can greatly improve your health. But if they are not taken as prescribed, they may not work. In some cases, not taking them correctly can be harmful. To help ensure your treatment remains effective and safe, understand your medications and how to take them. Bring your medications to each visit for review by your provider.  Your lab results, notes, and after visit summary will be available on My Chart. We strongly encourage you to use this feature. If lab results are abnormal the clinic will contact you with the appropriate steps. If the clinic does not contact you assume the results are satisfactory. You can always view your results on My Chart. If you have questions regarding your health or results, please contact the clinic during office hours. You can also ask questions on My Chart.  We at [facility] are grateful that you chose [place] to provide your care. We strive to provide evidence-based and compassionate care and are always looking for feedback. If you get a survey from the clinic please complete this so we can hear your opinions.  Heart-Healthy Eating Plan Many factors influence your heart health, including eating and exercise habits. Heart health is also called coronary health. Coronary risk increases with abnormal blood fat (lipid) levels. A heart-healthy eating plan includes limiting unhealthy fats, increasing healthy fats, limiting salt (sodium) intake, and making other diet and lifestyle changes. What is my plan? Your health care provider may recommend that: You limit your fat intake to _________% or less of your total calories each day. You limit your saturated fat intake to _________% or less of your total calories each day. You limit the amount of cholesterol in your diet to less than _________ mg per day. You limit the amount of sodium in your diet to less than _________  mg per day. What are tips for following this plan? Cooking Cook foods using methods other than frying. Baking, boiling, grilling, and broiling are all good options. Other ways to reduce fat include: Removing the skin from poultry. Removing all visible fats from meats. Steaming vegetables in water or broth. Meal planning  At meals, imagine dividing your plate into fourths: Fill one-half of your plate with vegetables and green salads. Fill one-fourth of your plate with whole grains. Fill one-fourth of your plate with lean protein foods. Eat 2-4 cups of vegetables per day. One cup of vegetables equals 1 cup (91 g) broccoli or cauliflower florets, 2 medium carrots, 1 large bell pepper, 1 large sweet potato, 1 large tomato, 1 medium white potato, 2 cups (150 g) raw leafy greens. Eat 1-2 cups of fruit per day. One cup of fruit equals 1 small apple, 1 large banana, 1 cup (237 g) mixed fruit, 1 large orange,  cup (82 g) dried fruit, 1 cup (240 mL) 100% fruit juice. Eat more foods that contain soluble fiber. Examples include apples, broccoli, carrots, beans, peas, and barley. Aim to get 25-30 g of fiber per day. Increase your consumption of legumes, nuts, and seeds to 4-5 servings per week. One serving of dried beans or legumes equals  cup (90 g) cooked, 1 serving of nuts is  oz (12 almonds, 24 pistachios, or 7 walnut halves), and 1 serving of seeds equals  oz (8 g). Fats Choose healthy fats more often. Choose monounsaturated and polyunsaturated fats, such as olive and canola oils, avocado oil, flaxseeds, walnuts, almonds, and seeds. Eat  more omega-3 fats. Choose salmon, mackerel, sardines, tuna, flaxseed oil, and ground flaxseeds. Aim to eat fish at least 2 times each week. Check food labels carefully to identify foods with trans fats or high amounts of saturated fat. Limit saturated fats. These are found in animal products, such as meats, butter, and cream. Plant sources of saturated fats  include palm oil, palm kernel oil, and coconut oil. Avoid foods with partially hydrogenated oils in them. These contain trans fats. Examples are stick margarine, some tub margarines, cookies, crackers, and other baked goods. Avoid fried foods. General information Eat more home-cooked food and less restaurant, buffet, and fast food. Limit or avoid alcohol. Limit foods that are high in added sugar and simple starches such as foods made using white refined flour (white breads, pastries, sweets). Lose weight if you are overweight. Losing just 5-10% of your body weight can help your overall health and prevent diseases such as diabetes and heart disease. Monitor your sodium intake, especially if you have high blood pressure. Talk with your health care provider about your sodium intake. Try to incorporate more vegetarian meals weekly. What foods should I eat? Fruits All fresh, canned (in natural juice), or frozen fruits. Vegetables Fresh or frozen vegetables (raw, steamed, roasted, or grilled). Green salads. Grains Most grains. Choose whole wheat and whole grains most of the time. Rice and pasta, including brown rice and pastas made with whole wheat. Meats and other proteins Lean, well-trimmed beef, veal, pork, and lamb. Chicken and [place] without skin. All fish and shellfish. Wild duck, rabbit, pheasant, and venison. Egg whites or low-cholesterol egg substitutes. Dried beans, peas, lentils, and tofu. Seeds and most nuts. Dairy Low-fat or nonfat cheeses, including ricotta and mozzarella. Skim or 1% milk (liquid, powdered, or evaporated). Buttermilk made with low-fat milk. Nonfat or low-fat yogurt. Fats and oils Non-hydrogenated (trans-free) margarines. Vegetable oils, including soybean, sesame, sunflower, olive, avocado, peanut, safflower, corn, canola, and cottonseed. Salad dressings or mayonnaise made with a vegetable oil. Beverages Water (mineral or sparkling). Coffee and tea. Unsweetened ice  tea. Diet beverages. Sweets and desserts Sherbet, gelatin, and fruit ice. Small amounts of dark chocolate. Limit all sweets and desserts. Seasonings and condiments All seasonings and condiments. The items listed above may not be a complete list of foods and beverages you can eat. Contact a dietitian for more options. What foods should I avoid? Fruits Canned fruit in heavy syrup. Fruit in cream or butter sauce. Fried fruit. Limit coconut. Vegetables Vegetables cooked in cheese, cream, or butter sauce. Fried vegetables. Grains Breads made with saturated or trans fats, oils, or whole milk. Croissants. Sweet rolls. Donuts. High-fat crackers, such as cheese crackers and chips. Meats and other proteins Fatty meats, such as hot dogs, ribs, sausage, bacon, rib-eye roast or steak. High-fat deli meats, such as salami and bologna. Caviar. Domestic duck and goose. Organ meats, such as liver. Dairy Cream, sour cream, cream cheese, and creamed cottage cheese. Whole-milk cheeses. Whole or 2% milk (liquid, evaporated, or condensed). Whole buttermilk. Cream sauce or high-fat cheese sauce. Whole-milk yogurt. Fats and oils Meat fat, or shortening. Cocoa butter, hydrogenated oils, palm oil, coconut oil, palm kernel oil. Solid fats and shortenings, including bacon fat, salt pork, lard, and butter. Nondairy cream substitutes. Salad dressings with cheese or sour cream. Beverages Regular sodas and any drinks with added sugar. Sweets and desserts Frosting. Pudding. Cookies. Cakes. Pies. Milk chocolate or white chocolate. Buttered syrups. Full-fat ice cream or ice cream drinks. The items listed above may  not be a complete list of foods and beverages to avoid. Contact a dietitian for more information. Summary Heart-healthy meal planning includes limiting unhealthy fats, increasing healthy fats, limiting salt (sodium) intake and making other diet and lifestyle changes. Lose weight if you are overweight. Losing just  5-10% of your body weight can help your overall health and prevent diseases such as diabetes and heart disease. Focus on eating a balance of foods, including fruits and vegetables, low-fat or nonfat dairy, lean protein, nuts and legumes, whole grains, and heart-healthy oils and fats. This information is not intended to replace advice given to you by your health care provider. Make sure you discuss any questions you have with your health care provider. Document Revised: 11/11/2021 Document Reviewed: 11/11/2021 Elsevier Patient Education  2024 [employer].

## 2024-09-06 ENCOUNTER — Ambulatory Visit (INDEPENDENT_AMBULATORY_CARE_PROVIDER_SITE_OTHER): Admitting: Nurse Practitioner

## 2024-09-06 ENCOUNTER — Encounter: Payer: Self-pay | Admitting: Nurse Practitioner

## 2024-09-06 VITALS — BP 128/78 | HR 75 | Temp 98.1°F | Resp 15 | Ht 67.99 in | Wt 173.0 lb

## 2024-09-06 DIAGNOSIS — R972 Elevated prostate specific antigen [PSA]: Secondary | ICD-10-CM

## 2024-09-06 DIAGNOSIS — I700 Atherosclerosis of aorta: Secondary | ICD-10-CM | POA: Diagnosis not present

## 2024-09-06 DIAGNOSIS — Z0000 Encounter for general adult medical examination without abnormal findings: Secondary | ICD-10-CM | POA: Diagnosis not present

## 2024-09-06 DIAGNOSIS — N1831 Chronic kidney disease, stage 3a: Secondary | ICD-10-CM

## 2024-09-06 DIAGNOSIS — E782 Mixed hyperlipidemia: Secondary | ICD-10-CM | POA: Diagnosis not present

## 2024-09-06 DIAGNOSIS — I10 Essential (primary) hypertension: Secondary | ICD-10-CM

## 2024-09-06 DIAGNOSIS — Z23 Encounter for immunization: Secondary | ICD-10-CM

## 2024-09-06 MED ORDER — FLUTICASONE PROPIONATE 50 MCG/ACT NA SUSP: 2.0000 | Freq: Every day | NASAL | 6 refills | Status: AC

## 2024-09-06 MED ORDER — ATORVASTATIN CALCIUM 20 MG PO TABS: 20.0000 mg | ORAL_TABLET | Freq: Every day | ORAL | 4 refills | Status: AC

## 2024-09-06 MED ORDER — LOSARTAN POTASSIUM 50 MG PO TABS: 50.0000 mg | ORAL_TABLET | Freq: Every day | ORAL | 4 refills | Status: AC

## 2024-09-06 NOTE — Assessment & Plan Note (Addendum)
 Chronic, ongoing.  Has significant family history with father and grandfather having strokes.  Continue daily statin at this time and adjust as needed.  Lipid panel today. Goal LDL <70 for this patient, would even push for tighter control at <55 due to family history.

## 2024-09-06 NOTE — Assessment & Plan Note (Signed)
 Chronic and stable.  BP at goal today.  Recommend he monitor BP at least a few mornings a week at home and document.  DASH diet at home.  Will continue Losartan  50 MG, this is offering benefit + offers kidney protection.  He was able to verbalize this back.  Educated him on BP goal and kidney disease.  Labs today: CBC, CMP, TSH.

## 2024-09-06 NOTE — Progress Notes (Signed)
 BP 128/78 (BP Location: Left Arm, Patient Position: Sitting, Cuff Size: Normal)   Pulse 75   Temp 98.1 F (36.7 C) (Oral)   Resp 15   Ht 5' 7.99 (1.727 m)   Wt 173 lb (78.5 kg)   SpO2 98%   BMI 26.31 kg/m    Subjective:    Patient ID: Nicholas Shah, male    DOB: [birth year], 73 y.o.   MRN: [id number]  HPI: Nicholas Shah is a 73 y.o. male presenting on 09/06/2024 for comprehensive medical examination. Current medical complaints include:none  He currently lives with: significant other Interim Problems from his last visit: no  HYPERTENSION / HYPERLIPIDEMIA Continues to take Losartan  25 MG and Atorvastatin  20 MG. Aortic atherosclerosis seen on imaging 02/12/21. Satisfied with current treatment? yes Duration of hypertension: chronic BP monitoring frequency: not checking BP range:  BP medication side effects: no Duration of hyperlipidemia: chronic Aspirin : no Recent stressors: no Recurrent headaches: no Visual changes: no Palpitations: no Dyspnea: no Chest pain: no Lower extremity edema: no Dizzy/lightheaded: no  The 10-year ASCVD risk score (Arnett DK, et al., 2019) is: 17.7%   Values used to calculate the score:     Age: 26 years     Clincally relevant sex: Male     Is Non-Hispanic African American: Yes     Diabetic: No     Tobacco smoker: No     Systolic Blood Pressure: 128 mmHg     Is BP treated: Yes     HDL Cholesterol: 62 mg/dL     Total Cholesterol: 154 mg/dL  CHRONIC KIDNEY DISEASE (CKD 3b) Noted past labs and monitoring.  CKD status: stable Medications renally dose: yes Previous renal evaluation: no Pneumovax:  Up to Date Influenza Vaccine:  Up to Date   ELEVATED PSA Follows with urology, last visit on 09/15/23 with PSA monitoring to be done annually. Duration: chronic Nocturia: 1-2x per night Urinary frequency:yes Incomplete voiding: yes Urgency: yes Weak urinary stream: no Straining to start stream: no Dysuria: no IPSS Questionnaire  (AUA-7): 8 AUA Over the past month.   1)  How often have you had a sensation of not emptying your bladder completely after you finish urinating?  2 - Less than half the time  2)  How often have you had to urinate again less than two hours after you finished urinating? 2 - Less than half the time  3)  How often have you found you stopped and started again several times when you urinated?  0 - Not at all  4) How difficult have you found it to postpone urination?  2 - Less than half the time  5) How often have you had a weak urinary stream?  0 - Not at all  6) How often have you had to push or strain to begin urination?  0 - Not at all  7) How many times did you most typically get up to urinate from the time you went to bed until the time you got up in the morning?  2 - 2 times  Total score:  0-7 mildly symptomatic   8-19 moderately symptomatic   20-35 severely symptomatic    Functional Status Survey: Is the patient deaf or have difficulty hearing?: No Does the patient have difficulty seeing, even when wearing glasses/contacts?: No Does the patient have difficulty concentrating, remembering, or making decisions?: No Does the patient have difficulty walking or climbing stairs?: No Does the patient have difficulty dressing or bathing?: No  Does the patient have difficulty doing errands alone such as visiting a doctor's office or shopping?: No  FALL RISK:    09/06/2024    8:15 AM 02/29/2024    8:17 AM 10/01/2023    8:10 AM 09/02/2023    8:33 AM 02/25/2023    8:46 AM  Fall Risk   Falls in the past year? 0 0 0 0 0  Number falls in past yr: 0 0 0 0 0  Injury with Fall? 0 0 0 0 0  Risk for fall due to : No Fall Risks No Fall Risks No Fall Risks No Fall Risks No Fall Risks  Follow up Falls evaluation completed Falls evaluation completed Falls evaluation completed Falls evaluation completed Falls evaluation completed   Depression Screen    09/06/2024    8:15 AM 02/29/2024    8:18 AM 10/01/2023     8:10 AM 09/02/2023    8:33 AM 02/25/2023    8:46 AM  Depression screen PHQ 2/9  Decreased Interest 0 1 0 1 0  Down, Depressed, Hopeless 0 0 0 0 0  PHQ - 2 Score 0 1 0 1 0  Altered sleeping 1 1 1 1 1   Tired, decreased energy 0 1 1 2 2   Change in appetite 0 1 1 1  0  Feeling bad or failure about yourself  0 0 0 0 0  Trouble concentrating 0 1 0 0 0  Moving slowly or fidgety/restless 0 0 0 0 0  Suicidal thoughts 0 0 0 0 0  PHQ-9 Score 1 5  3  5  3    Difficult doing work/chores  Not difficult at all Not difficult at all Not difficult at all Not difficult at all     Data saved with a previous flowsheet row definition      09/06/2024    8:16 AM 02/29/2024    8:18 AM 10/01/2023    8:11 AM 09/02/2023    8:34 AM  GAD 7 : Generalized Anxiety Score  Nervous, Anxious, on Edge 0 0 0 1  Control/stop worrying 1 0 1 1  Worry too much - different things 1 0 1 1  Trouble relaxing 0 0 0 0  Restless 0 0 0 0  Easily annoyed or irritable 0 0 0 0  Afraid - awful might happen 0 0 0 0  Total GAD 7 Score 2 0 2 3  Anxiety Difficulty Not difficult at all Not difficult at all Not difficult at all Not difficult at all   A voluntary discussion about advance care planning including the explanation and discussion of advance directives was extensively discussed  with the patient for 15 minutes with patient present.  Explanation about the health care proxy and Living will was reviewed and packet with forms with explanation of how to fill them out was given.  During this discussion, the patient was able to identify a health care proxy as unsure and plans to fill out the paperwork required.  Patient was offered a separate Advance Care Planning visit for further assistance with forms.    Past Medical History:  Past Medical History:  Diagnosis Date   Allergy    Arthritis    Hypertension    Surgical History:  Past Surgical History:  Procedure Laterality Date   APPENDECTOMY     COLONOSCOPY     COLONOSCOPY  WITH PROPOFOL  N/A 10/08/2023   Procedure: COLONOSCOPY WITH PROPOFOL ;  Surgeon: Therisa Bi, MD;  Location: [facility] ENDOSCOPY;  Service: Gastroenterology;  Laterality: N/A;   COSMETIC SURGERY     CYSTOSCOPY WITH INSERTION OF UROLIFT N/A 05/11/2018   Procedure: CYSTOSCOPY WITH INSERTION OF UROLIFT;  Surgeon: Kassie Ozell SAUNDERS, MD;  Location: [facility] ORS;  Service: Urology;  Laterality: N/A;   FACIAL RECONSTRUCTION SURGERY  1974   WIRES   KNEE ARTHROSCOPY Right 04/17/2016   Procedure: ARTHROSCOPY KNEE, MEDIAL AND LATERAL PARTIAL MENISECTOMIES;  Surgeon: Franky Cranker, MD;  Location: [facility] ORS;  Service: Orthopedics;  Laterality: Right;   MINOR CARPAL TUNNEL     POLYPECTOMY  10/08/2023   Procedure: POLYPECTOMY;  Surgeon: Therisa Bi, MD;  Location: [facility] ENDOSCOPY;  Service: Gastroenterology;;   PROSTATE BIOPSY N/A 07/19/2020   Procedure: PROSTATE BIOPSY GRAYCE LIGHT;  Surgeon: Kassie Ozell SAUNDERS, MD;  Location: [facility] ORS;  Service: Urology;  Laterality: N/A;   TRIGGER FINGER RELEASE      Medications:  Current Outpatient Medications on File Prior to Visit  Medication Sig   loratadine  (CLARITIN ) 10 MG tablet Take 1 tablet (10 mg total) by mouth daily.   tadalafil  (CIALIS ) 20 MG tablet Take 1 tablet (20 mg total) by mouth daily as needed for erectile dysfunction.   tadalafil  (CIALIS ) 5 MG tablet Take 1 tablet (5 mg total) by mouth daily.   No current facility-administered medications on file prior to visit.    Allergies:  No Known Allergies  Social History:  Social History   Socioeconomic History   Marital status: Legally Separated    Spouse name: Not on file   Number of children: Not on file   Years of education: Not on file   Highest education level: Associate degree: academic program  Occupational History   Not on file  Tobacco Use   Smoking status: Never    Passive exposure: Never   Smokeless tobacco: Never  Vaping Use   Vaping status: Never Used  Substance and Sexual Activity    Alcohol use: No   Drug use: No   Sexual activity: Not Currently  Other Topics Concern   Not on file  Social History Narrative   Not on file   Social Drivers of Health   Financial Resource Strain: Low Risk  (09/04/2024)   Overall Financial Resource Strain (CARDIA)    Difficulty of Paying Living Expenses: Not very hard  Food Insecurity: No Food Insecurity (09/04/2024)   Hunger Vital Sign    Worried About Running Out of Food in the Last Year: Never true    Ran Out of Food in the Last Year: Never true  Transportation Needs: No Transportation Needs (09/04/2024)   PRAPARE - Administrator, Civil Service (Medical): No    Lack of Transportation (Non-Medical): No  Physical Activity: Insufficiently Active (02/25/2024)   Exercise Vital Sign    Days of Exercise per Week: 4 days    Minutes of Exercise per Session: 10 min  Stress: No Stress Concern Present (02/25/2024)   [employer] of Occupational Health - Occupational Stress Questionnaire    Feeling of Stress : Only a little  Social Connections: Moderately Integrated (09/04/2024)   Social Connection and Isolation Panel    Frequency of Communication with Friends and Family: More than three times a week    Frequency of Social Gatherings with Friends and Family: More than three times a week    Attends Religious Services: More than 4 times per year    Active Member of [employer] or Organizations: Yes    Attends Banker Meetings: More than 4 times per  year    Marital Status: Divorced  Catering Manager Violence: Not At Risk (09/02/2023)   Humiliation, Afraid, Rape, and Kick questionnaire    Fear of Current or Ex-Partner: No    Emotionally Abused: No    Physically Abused: No    Sexually Abused: No   Social History   Tobacco Use  Smoking Status Never   Passive exposure: Never  Smokeless Tobacco Never   Social History   Substance and Sexual Activity  Alcohol Use No    Family History:  Family History  Problem  Relation Age of Onset   CAD Father    Lung cancer Father    Heart failure Father    Diabetes Maternal Grandfather    Diabetes Paternal Grandmother    CAD Paternal Grandfather    Heart disease Paternal Grandfather    Past medical history, surgical history, medications, allergies, family history and social history reviewed with patient today and changes made to appropriate areas of the chart.   ROS All other ROS negative except what is listed above and in the HPI.      Objective:    BP 128/78 (BP Location: Left Arm, Patient Position: Sitting, Cuff Size: Normal)   Pulse 75   Temp 98.1 F (36.7 C) (Oral)   Resp 15   Ht 5' 7.99 (1.727 m)   Wt 173 lb (78.5 kg)   SpO2 98%   BMI 26.31 kg/m   Wt Readings from Last 3 Encounters:  09/06/24 173 lb (78.5 kg)  05/04/24 172 lb (78 kg)  02/29/24 173 lb (78.5 kg)    Physical Exam Vitals and nursing note reviewed.  Constitutional:      General: He is awake. He is not in acute distress.    Appearance: He is well-developed and well-groomed. He is not ill-appearing or toxic-appearing.  HENT:     Head: Normocephalic and atraumatic.     Right Ear: Hearing, tympanic membrane, ear canal and external ear normal. No drainage.     Left Ear: Hearing, tympanic membrane, ear canal and external ear normal. No drainage.     Nose: Nose normal.     Mouth/Throat:     Pharynx: Uvula midline.  Eyes:     General: Lids are normal.        Right eye: No discharge.        Left eye: No discharge.     Extraocular Movements: Extraocular movements intact.     Conjunctiva/sclera: Conjunctivae normal.     Pupils: Pupils are equal, round, and reactive to light.     Visual Fields: Right eye visual fields normal and left eye visual fields normal.  Neck:     Thyroid: No thyromegaly.     Vascular: No carotid bruit or JVD.     Trachea: Trachea normal.  Cardiovascular:     Rate and Rhythm: Normal rate and regular rhythm.     Heart sounds: Normal heart sounds, S1  normal and S2 normal. No murmur heard.    No gallop.  Pulmonary:     Effort: Pulmonary effort is normal. No accessory muscle usage or respiratory distress.     Breath sounds: Normal breath sounds.  Abdominal:     General: Bowel sounds are normal.     Palpations: Abdomen is soft. There is no hepatomegaly or splenomegaly.     Tenderness: There is no abdominal tenderness.  Musculoskeletal:        General: Normal range of motion.     Cervical back: Normal  range of motion and neck supple.     Right lower leg: No edema.     Left lower leg: No edema.  Lymphadenopathy:     Head:     Right side of head: No submental, submandibular, tonsillar, preauricular or posterior auricular adenopathy.     Left side of head: No submental, submandibular, tonsillar, preauricular or posterior auricular adenopathy.     Cervical: No cervical adenopathy.  Skin:    General: Skin is warm and dry.     Capillary Refill: Capillary refill takes less than 2 seconds.     Findings: No rash.  Neurological:     Mental Status: He is alert and oriented to person, place, and time.     Gait: Gait is intact.     Deep Tendon Reflexes: Reflexes are normal and symmetric.     Reflex Scores:      Brachioradialis reflexes are 2+ on the right side and 2+ on the left side.      Patellar reflexes are 2+ on the right side and 2+ on the left side. Psychiatric:        Attention and Perception: Attention normal.        Mood and Affect: Mood normal.        Speech: Speech normal.        Behavior: Behavior normal. Behavior is cooperative.        Thought Content: Thought content normal.        Cognition and Memory: Cognition normal.       09/02/2023    8:29 AM  6CIT Screen  What Year? 0 points  What month? 0 points  What time? 0 points  Count back from 20 0 points  Months in reverse 0 points  Repeat phrase 4 points  Total Score 4 points   Results for orders placed or performed in visit on 02/29/24  Basic metabolic panel with  GFR   Collection Time: 02/29/24  8:42 AM  Result Value Ref Range   Glucose 97 70 - 99 mg/dL   BUN 21 8 - 27 mg/dL   Creatinine, Ser 8.34 (H) 0.76 - 1.27 mg/dL   eGFR 44 (L) >40 fO/fpw/8.26   BUN/Creatinine Ratio 13 10 - 24   Sodium 141 134 - 144 mmol/L   Potassium 5.0 3.5 - 5.2 mmol/L   Chloride 105 96 - 106 mmol/L   CO2 23 20 - 29 mmol/L   Calcium  9.8 8.6 - 10.2 mg/dL  Lipid Panel w/o Chol/HDL Ratio   Collection Time: 02/29/24  8:42 AM  Result Value Ref Range   Cholesterol, Total 154 100 - 199 mg/dL   Triglycerides 71 0 - 149 mg/dL   HDL 62 >60 mg/dL   VLDL Cholesterol Cal 14 5 - 40 mg/dL   LDL Chol Calc (NIH) 78 0 - 99 mg/dL  Microalbumin, Urine Waived   Collection Time: 02/29/24  8:42 AM  Result Value Ref Range   Microalb, Ur Waived 150 (H) 0 - 19 mg/L   Creatinine, Urine Waived 200 10 - 300 mg/dL   Microalb/Creat Ratio 30-300 (H) <30 mg/g      Assessment & Plan:   Problem List Items Addressed This Visit       Cardiovascular and Mediastinum   Essential hypertension   Chronic and stable.  BP at goal today.  Recommend he monitor BP at least a few mornings a week at home and document.  DASH diet at home.  Will continue Losartan  50 MG, this  is offering benefit + offers kidney protection.  He was able to verbalize this back.  Educated him on BP goal and kidney disease.  Labs today: CBC, CMP, TSH.        Relevant Medications   atorvastatin  (LIPITOR) 20 MG tablet   losartan  (COZAAR ) 50 MG tablet   Other Relevant Orders   CBC with Differential/Platelet   Comprehensive metabolic panel with GFR   TSH   Aortic atherosclerosis   Chronic, stable.  Imaging on 02/12/21 -- educated patient on finding.  Recommend continue statin daily for prevention + daily Baby ASA 81 MG.        Relevant Medications   atorvastatin  (LIPITOR) 20 MG tablet   losartan  (COZAAR ) 50 MG tablet     Genitourinary   Stage 3a chronic kidney disease (HCC) - Primary   Chronic,ongoing.  Continue ARB for  kidney function.  Recommend increase water  intake at home + avoid Ibuprofen products + contrast unless absolutely needed.  LABS: CBC and CMP.  Current kidney failure risk equation is 4.45% for 5 years, recommendation to send to nephrology if 3-5% on review.  Plan on recheck today and if ongoing elevation will discuss with patient possibly seeing nephrology. Could also consider addition of Farxiga or Jardiance in future.      Relevant Orders   CBC with Differential/Platelet   Comprehensive metabolic panel with GFR     Other   Hyperlipidemia   Chronic, ongoing.  Has significant family history with father and grandfather having strokes.  Continue daily statin at this time and adjust as needed.  Lipid panel today. Goal LDL <70 for this patient, would even push for tighter control at <55 due to family history.      Relevant Medications   atorvastatin  (LIPITOR) 20 MG tablet   losartan  (COZAAR ) 50 MG tablet   Other Relevant Orders   Comprehensive metabolic panel with GFR   Lipid Panel w/o Chol/HDL Ratio   Elevated PSA   Chronic, stable.  Being monitored by urology, will continue this collaboration.  Recent note and labs reviewed.      Relevant Orders   PSA Total (Reflex To Free)([facility])   Other Visit Diagnoses       Encounter for annual physical exam       Annual physical today with labs and health maintenance reviewed, discussed with patient.        Discussed aspirin  prophylaxis for myocardial infarction prevention and decision was it was not indicated  LABORATORY TESTING:  Health maintenance labs ordered today as discussed above.   The natural history of prostate cancer and ongoing controversy regarding screening and potential treatment outcomes of prostate cancer has been discussed with the patient. The meaning of a false positive PSA and a false negative PSA has been discussed. He indicates understanding of the limitations of this screening test and wishes to proceed with  screening PSA testing -- done with urology.   IMMUNIZATIONS:   - Tdap: Tetanus vaccination status reviewed: last tetanus booster within 10 years. - Influenza: Up to date - Pneumovax: PCV20 up to date - Prevnar: Up To Date - Zostavax vaccine: Up To Date - Covid vaccine: Up To Date -- has obtained 5  SCREENING: - Colonoscopy: Up To Date -- next due 10/07/24 Discussed with patient purpose of the colonoscopy is to detect colon cancer at curable precancerous or early stages   - AAA Screening: Not applicable  -Hearing Test: Not applicable  -Spirometry: Not applicable   PATIENT COUNSELING:  Sexuality: Discussed sexually transmitted diseases, partner selection, use of condoms, avoidance of unintended pregnancy  and contraceptive alternatives.   Advised to avoid cigarette smoking.  I discussed with the patient that most people either abstain from alcohol or drink within safe limits (<=14/week and <=4 drinks/occasion for males, <=7/weeks and <= 3 drinks/occasion for females) and that the risk for alcohol disorders and other health effects rises proportionally with the number of drinks per week and how often a drinker exceeds daily limits.  Discussed cessation/primary prevention of drug use and availability of treatment for abuse.   Diet: Encouraged to adjust caloric intake to maintain  or achieve ideal body weight, to reduce intake of dietary saturated fat and total fat, to limit sodium intake by avoiding high sodium foods and not adding table salt, and to maintain adequate dietary potassium and calcium  preferably from fresh fruits, vegetables, and low-fat dairy products.    Stressed the importance of regular exercise  Injury prevention: Discussed safety belts, safety helmets, smoke detector, smoking near bedding or upholstery.   Dental health: Discussed importance of regular tooth brushing, flossing, and dental visits.   Follow up plan: NEXT PREVENTATIVE PHYSICAL DUE IN 1 YEAR. Return  in about 6 months (around 03/06/2025) for HTN/HLD.

## 2024-09-06 NOTE — Assessment & Plan Note (Signed)
 Chronic, stable.  Being monitored by urology, will continue this collaboration.  Recent note and labs reviewed.

## 2024-09-06 NOTE — Assessment & Plan Note (Signed)
 Chronic,ongoing.  Continue ARB for kidney function.  Recommend increase water  intake at home + avoid Ibuprofen products + contrast unless absolutely needed.  LABS: CBC and CMP.  Current kidney failure risk equation is 4.45% for 5 years, recommendation to send to nephrology if 3-5% on review.  Plan on recheck today and if ongoing elevation will discuss with patient possibly seeing nephrology. Could also consider addition of Farxiga or Jardiance in future.

## 2024-09-06 NOTE — Assessment & Plan Note (Signed)
 Chronic, stable.  Imaging on 02/12/21 -- educated patient on finding.  Recommend continue statin daily for prevention + daily Baby ASA 81 MG.

## 2024-09-07 ENCOUNTER — Ambulatory Visit: Payer: Self-pay | Admitting: Nurse Practitioner

## 2024-09-07 LAB — PSA TOTAL (REFLEX TO FREE): Prostate Specific Ag, Serum: 10.9 ng/mL — ABNORMAL HIGH (ref 0.0–4.0)

## 2024-09-07 LAB — TSH: TSH: 1.18 u[IU]/mL (ref 0.450–4.500)

## 2024-09-07 LAB — COMPREHENSIVE METABOLIC PANEL WITH GFR
ALT: 16 IU/L (ref 0–44)
AST: 21 IU/L (ref 0–40)
Albumin: 4.4 g/dL (ref 3.8–4.8)
Alkaline Phosphatase: 80 IU/L (ref 47–123)
BUN/Creatinine Ratio: 12 (ref 10–24)
BUN: 18 mg/dL (ref 8–27)
Bilirubin Total: 0.4 mg/dL (ref 0.0–1.2)
CO2: 23 mmol/L (ref 20–29)
Calcium: 9 mg/dL (ref 8.6–10.2)
Chloride: 105 mmol/L (ref 96–106)
Creatinine, Ser: 1.46 mg/dL — ABNORMAL HIGH (ref 0.76–1.27)
Globulin, Total: 2.3 g/dL (ref 1.5–4.5)
Glucose: 102 mg/dL — ABNORMAL HIGH (ref 70–99)
Potassium: 4.6 mmol/L (ref 3.5–5.2)
Sodium: 141 mmol/L (ref 134–144)
Total Protein: 6.7 g/dL (ref 6.0–8.5)
eGFR: 51 mL/min/1.73 — ABNORMAL LOW (ref >59)

## 2024-09-07 LAB — LIPID PANEL W/O CHOL/HDL RATIO
Cholesterol, Total: 150 mg/dL (ref 100–199)
HDL: 67 mg/dL (ref >39)
LDL Chol Calc (NIH): 63 mg/dL (ref 0–99)
Triglycerides: 111 mg/dL (ref 0–149)
VLDL Cholesterol Cal: 20 mg/dL (ref 5–40)

## 2024-09-07 LAB — CBC WITH DIFFERENTIAL/PLATELET
Basophils Absolute: 0.1 x10E3/uL (ref 0.0–0.2)
Basos: 1 %
EOS (ABSOLUTE): 0.2 x10E3/uL (ref 0.0–0.4)
Eos: 4 %
Hematocrit: 45 % (ref 37.5–51.0)
Hemoglobin: 14.9 g/dL (ref 13.0–17.7)
Immature Grans (Abs): 0 x10E3/uL (ref 0.0–0.1)
Immature Granulocytes: 0 %
Lymphocytes Absolute: 1.7 x10E3/uL (ref 0.7–3.1)
Lymphs: 38 %
MCH: 31.1 pg (ref 26.6–33.0)
MCHC: 33.1 g/dL (ref 31.5–35.7)
MCV: 94 fL (ref 79–97)
Monocytes Absolute: 0.4 x10E3/uL (ref 0.1–0.9)
Monocytes: 10 %
Neutrophils Absolute: 2 x10E3/uL (ref 1.4–7.0)
Neutrophils: 47 %
Platelets: 200 x10E3/uL (ref 150–450)
RBC: 4.79 x10E6/uL (ref 4.14–5.80)
RDW: 12.8 % (ref 11.6–15.4)
WBC: 4.4 x10E3/uL (ref 3.4–10.8)

## 2024-09-07 NOTE — Progress Notes (Signed)
 Contacted via MyChart  Good morning Ruble, your labs have returned: - Kidney function, creatinine and eGFR, continues to show Stage 3a kidney disease. Ensure minimal Ibuprofen use at home and good water  intake. Continue blood pressure medications as ordered. - Liver function, AST and ALT, is normal. - PSA has trended up a little, I will pass this onto urology for your visit with them upcoming. - Remainder of labs stable. Any questions? Keep being amazing!!  Thank you for allowing me to participate in your care.  I appreciate you. Kindest regards, Shamaine Mulkern

## 2024-09-07 NOTE — Progress Notes (Signed)
 I saw him yesterday for his physical and obtained PSA for your visit upcoming with him. FYI for your visit.

## 2024-09-13 ENCOUNTER — Ambulatory Visit: Admitting: Urology

## 2024-09-13 VITALS — BP 149/87 | HR 79 | Wt 173.0 lb

## 2024-09-13 DIAGNOSIS — N138 Other obstructive and reflux uropathy: Secondary | ICD-10-CM

## 2024-09-13 DIAGNOSIS — N529 Male erectile dysfunction, unspecified: Secondary | ICD-10-CM | POA: Diagnosis not present

## 2024-09-13 DIAGNOSIS — R972 Elevated prostate specific antigen [PSA]: Secondary | ICD-10-CM

## 2024-09-13 DIAGNOSIS — N401 Enlarged prostate with lower urinary tract symptoms: Secondary | ICD-10-CM

## 2024-09-13 LAB — BLADDER SCAN AMB NON-IMAGING

## 2024-09-13 NOTE — Progress Notes (Signed)
   09/13/2024 10:11 AM   Nicholas Shah February 05, 1951 [id number]  Reason for visit: Follow up elevated PSA, ED, BPH status post UroLift  History: Previously followed by Dr. Gala, underwent UroLift in 2019 with good results PSA up to 7.5 2021 prompting a prostate MRI, 65 g prostate and possible PI-RADS 4 lesions, underwent MRI fusion biopsy with Dr. Gala that showed inflammation but no malignancy Had a prostate IntelliScore suggesting a less than 10% chance of clinically significant prostate cancer Care transferred to [facility] health urology in January 2024 Currently on Cialis  for ED  Physical Exam: BP (!) 149/87 (BP Location: Left Arm, Patient Position: Sitting, Cuff Size: Normal)   Pulse 79   Wt 173 lb (78.5 kg)   SpO2 99%   BMI 26.31 kg/m   Imaging/labs: PSA history reviewed, most recent PSA 10.9 from November 2025, overall stable over the last few years including 9.25 August 2023, 9.22 Feb 2023, 10.27 August 2022, 7.22 Feb 2019  Today: Cialis  helps but not sufficient for sexual intercourse, currently on the 5 mg daily with 20 mg boost dose, interested in other options  Denies any urinary symptoms today, PVR normal at 7ml  Plan:   Elevated PSA: History of negative prostate biopsy 2021, chronic inflammation, PSA overall stable over the last few years.  Risks and benefits of screening again reviewed, he defers repeat MRI or biopsy, will repeat PSA in 1 year if significant increase could consider repeat MRI, but limited by UroLift clips ED: Failed PDE 5 inhibitors, will check testosterone  today.  If low PA follow-up to consider replacement options, will need to keep in mind impact of testosterone  on PSA if go down that route.  He is not interested in penile injections BPH: Symptoms resolved after UroLift Call with testosterone  results, RTC 1 year PHI score prior   Nicholas JAYSON Burnet, MD  [facility] [address], [address] [place], [place] [address] [phone]

## 2024-09-13 NOTE — Patient Instructions (Signed)
 Understanding Erectile Dysfunction (ED)  What is ED? Erectile Dysfunction, or ED, is when a man has trouble getting or keeping an erection enough for sex. It is common and can happen at any age but is more common as men get older.  What Causes ED? ED can happen for many reasons, including:    Stress or feeling anxious Health problems like diabetes, heart disease, or high blood pressure Lifestyle habits like smoking, drinking too much alcohol, drug use, or not enough exercise Certain medicines(some blood pressure medications, antidepressants, sedatives)  Symptoms of ED:   Difficulty getting an erection Trouble keeping an erection during sex Reduced interest in sex  Treatment Options There are different ways to treat ED. Talk to your doctor to find what's best for you.  Lifestyle Changes   Exercise regularly Eat healthy foods Quit smoking and limit alcohol Weight loss Reduce stress  Medications   Oral medicines like Viagra(sildenafil) or Cialis (tadalafil ).  These are generic and affordable, the lowest price is at [email or web address].  These must be taken about an hour before sexual activity, and still require sexual stimulation to get an erection Side effects can include stuffy nose, headache, muscle pain, or changes in vision There is no risk of heart attack or stroke when medications are taken as directed These medications cannot be taken with nitrates for chest pain  Other Treatments    Penile injections-a medicine is injected directly into the penis to give you an immediate erection Devices like pump vacuum systems that help create an erection Surgery: Penile prosthesis for patients with no improvement with medicines or injections who are still interested in sexual activity.  Visit [email or web address] for more details.  Dr. Lovie is a urologist in [facility] with [facility] who performs penile prosthesis surgeries Counseling or Therapy    If ED is caused by  stress, anxiety, or relationship issues, talking to a counselor or therapist can help.

## 2024-09-14 ENCOUNTER — Ambulatory Visit: Payer: Self-pay | Admitting: Urology

## 2024-09-14 LAB — TESTOSTERONE: Testosterone: 484 ng/dL (ref 264–916)

## 2024-11-03 ENCOUNTER — Other Ambulatory Visit: Payer: Self-pay | Admitting: Nurse Practitioner

## 2024-11-24 ENCOUNTER — Ambulatory Visit: Admitting: Emergency Medicine

## 2024-11-24 VITALS — BP 140/74 | Ht 68.0 in | Wt 175.4 lb

## 2024-11-24 DIAGNOSIS — Z0000 Encounter for general adult medical examination without abnormal findings: Secondary | ICD-10-CM

## 2024-11-24 DIAGNOSIS — Z1211 Encounter for screening for malignant neoplasm of colon: Secondary | ICD-10-CM

## 2024-11-24 NOTE — Progress Notes (Signed)
 "  Chief Complaint  Patient presents with   Medicare Wellness     Subjective:   Nicholas Shah is a 74 y.o. male who presents for a Medicare Annual Wellness Visit.  Visit info / Clinical Intake: Medicare Wellness Visit Type:: Subsequent Annual Wellness Visit Persons participating in visit and providing information:: patient Medicare Wellness Visit Mode:: In-person (required for WTM) Interpreter Needed?: No Pre-visit prep was completed: yes AWV questionnaire completed by patient prior to visit?: yes Date:: 11/24/24 Living arrangements:: (!) lives alone Patient's Overall Health Status Rating: good Typical amount of pain: none Does pain affect daily life?: no Are you currently prescribed opioids?: no  Dietary Habits and Nutritional Risks How many meals a day?: 2 Eats fruit and vegetables daily?: (!) no Most meals are obtained by: eating out Diabetic:: no  Functional Status Activities of Daily Living (to include ambulation/medication): Independent Ambulation: Independent with device- listed below Home Assistive Devices/Equipment: Eyeglasses Medication Administration: Independent Home Management (perform basic housework or laundry): Independent Manage your own finances?: yes Primary transportation is: driving Concerns about vision?: no *vision screening is required for WTM* Concerns about hearing?: no  Fall Screening Falls in the past year?: 0 Number of falls in past year: 0 Was there an injury with Fall?: 0 Fall Risk Category Calculator: 0 Patient Fall Risk Level: Low Fall Risk  Fall Risk Patient at Risk for Falls Due to: No Fall Risks Fall risk Follow up: Falls evaluation completed  Home and Transportation Safety: All rugs have non-skid backing?: yes All stairs or steps have railings?: yes Grab bars in the bathtub or shower?: (!) no Have non-skid surface in bathtub or shower?: yes Good home lighting?: yes Regular seat belt use?: yes Hospital stays in the last  year:: no  Cognitive Assessment Difficulty concentrating, remembering, or making decisions? : yes (memory) Will 6CIT or Mini Cog be Completed: yes What year is it?: 0 points What month is it?: 0 points Give patient an address phrase to remember (5 components): [address] [place] About what time is it?: 0 points Count backwards from 20 to 1: 0 points Say the months of the year in reverse: 0 points Repeat the address phrase from earlier: 0 points 6 CIT Score: 0 points  Advance Directives (For Healthcare) Does Patient Have a Medical Advance Directive?: No Would patient like information on creating a medical advance directive?: No - Patient declined  Reviewed/Updated  Reviewed/Updated: Reviewed All (Medical, Surgical, Family, Medications, Allergies, Care Teams, Patient Goals)    Allergies (verified) Patient has no known allergies.   Current Medications (verified) Outpatient Encounter Medications as of 11/24/2024  Medication Sig   atorvastatin  (LIPITOR) 20 MG tablet Take 1 tablet (20 mg total) by mouth daily.   loratadine  (CLARITIN ) 10 MG tablet Take 1 tablet by mouth once daily   losartan  (COZAAR ) 50 MG tablet Take 1 tablet (50 mg total) by mouth daily.   tadalafil  (CIALIS ) 20 MG tablet Take 1 tablet (20 mg total) by mouth daily as needed for erectile dysfunction.   tadalafil  (CIALIS ) 5 MG tablet Take 1 tablet (5 mg total) by mouth daily.   fluticasone  (FLONASE ) 50 MCG/ACT nasal spray Place 2 sprays into both nostrils daily. (Patient not taking: Reported on 11/24/2024)   No facility-administered encounter medications on file as of 11/24/2024.    History: Past Medical History:  Diagnosis Date   Allergy    Arthritis    Hypertension    Past Surgical History:  Procedure Laterality Date  APPENDECTOMY     COLONOSCOPY     COLONOSCOPY WITH PROPOFOL  N/A 10/08/2023   Procedure: COLONOSCOPY WITH PROPOFOL ;  Surgeon: Therisa Bi, MD;  Location: [facility] ENDOSCOPY;  Service:  Gastroenterology;  Laterality: N/A;   COSMETIC SURGERY     CYSTOSCOPY WITH INSERTION OF UROLIFT N/A 05/11/2018   Procedure: CYSTOSCOPY WITH INSERTION OF UROLIFT;  Surgeon: Kassie Ozell SAUNDERS, MD;  Location: [facility] ORS;  Service: Urology;  Laterality: N/A;   FACIAL RECONSTRUCTION SURGERY  1974   WIRES   KNEE ARTHROSCOPY Right 04/17/2016   Procedure: ARTHROSCOPY KNEE, MEDIAL AND LATERAL PARTIAL MENISECTOMIES;  Surgeon: Franky Cranker, MD;  Location: [facility] ORS;  Service: Orthopedics;  Laterality: Right;   MINOR CARPAL TUNNEL     POLYPECTOMY  10/08/2023   Procedure: POLYPECTOMY;  Surgeon: Therisa Bi, MD;  Location: [facility] ENDOSCOPY;  Service: Gastroenterology;;   PROSTATE BIOPSY N/A 07/19/2020   Procedure: PROSTATE BIOPSY GRAYCE LIGHT;  Surgeon: Kassie Ozell SAUNDERS, MD;  Location: [facility] ORS;  Service: Urology;  Laterality: N/A;   TRIGGER FINGER RELEASE     Family History  Problem Relation Age of Onset   CAD Father    Lung cancer Father    Heart failure Father    Cancer Father    Stroke Father    Diabetes Maternal Grandfather    Diabetes Paternal Grandmother    CAD Paternal Grandfather    Heart disease Paternal Grandfather    Social History   Occupational History   Not on file  Tobacco Use   Smoking status: Never    Passive exposure: Never   Smokeless tobacco: Never  Vaping Use   Vaping status: Never Used  Substance and Sexual Activity   Alcohol use: Never   Drug use: Never   Sexual activity: Not Currently   Tobacco Counseling Counseling given: Not Answered  SDOH Screenings   Food Insecurity: No Food Insecurity (11/24/2024)  Housing: Low Risk (11/24/2024)  Transportation Needs: No Transportation Needs (11/24/2024)  Utilities: Not At Risk (11/24/2024)  Alcohol Screen: Low Risk (09/02/2023)  Depression (PHQ2-9): Low Risk (11/24/2024)  Financial Resource Strain: Low Risk (09/04/2024)  Physical Activity: Insufficiently Active (11/24/2024)  Social Connections: Moderately Integrated  (11/24/2024)  Stress: No Stress Concern Present (11/24/2024)  Tobacco Use: Low Risk (11/24/2024)  Health Literacy: Adequate Health Literacy (11/24/2024)   See flowsheets for full screening details  Depression Screen PHQ 2 & 9 Depression Scale- Over the past 2 weeks, how often have you been bothered by any of the following problems? Little interest or pleasure in doing things: 0 Feeling down, depressed, or hopeless (PHQ Adolescent also includes...irritable): 1 PHQ-2 Total Score: 1 Trouble falling or staying asleep, or sleeping too much: 1 Feeling tired or having little energy: 0 Poor appetite or overeating (PHQ Adolescent also includes...weight loss): 0 Feeling bad about yourself - or that you are a failure or have let yourself or your family down: 0 Trouble concentrating on things, such as reading the newspaper or watching television (PHQ Adolescent also includes...like school work): 0 Moving or speaking so slowly that other people could have noticed. Or the opposite - being so fidgety or restless that you have been moving around a lot more than usual: 0 Thoughts that you would be better off dead, or of hurting yourself in some way: 0 PHQ-9 Total Score: 2 If you checked off any problems, how difficult have these problems made it for you to do your work, take care of things at home, or get along with other people?: Not  difficult at all  Depression Treatment Depression Interventions/Treatment : EYV7-0 Score <4 Follow-up Not Indicated     Goals Addressed               This Visit's Progress     DIET - INCREASE WATER  INTAKE (pt-stated)               Objective:    Today's Vitals   11/24/24 1605  BP: (!) 140/74  Weight: 175 lb 6.4 oz (79.6 kg)  Height: 5' 8 (1.727 m)   Body mass index is 26.67 kg/m.  Hearing/Vision screen Hearing Screening - Comments:: Denies hearing loss  Vision Screening - Comments:: UTD w/ Dr. Lynwood Hasting [facility] [place] Immunizations and Health  Maintenance Health Maintenance  Topic Date Due   COVID-19 Vaccine (6 - 2025-26 season) 06/20/2024   Colonoscopy  09/06/2025 (Originally 10/07/2024)   Medicare Annual Wellness (AWV)  11/24/2025   DTaP/Tdap/Td (2 - Td or Tdap) 12/17/2027   Pneumococcal Vaccine: 50+ Years  Completed   Influenza Vaccine  Completed   Hepatitis C Screening  Completed   Zoster Vaccines- Shingrix   Completed   Meningococcal B Vaccine  Aged Out        Assessment/Plan:  This is a routine wellness examination for [facility].  Patient Care Team: Valerio Melanie DASEN, NP as PCP - General (Nurse Practitioner) Francisca Redell BROCKS, MD as Consulting Physician (Urology) Hasting Lynwood, OD [facility])  I have personally reviewed and noted the following in the patients chart:   Medical and social history Use of alcohol, tobacco or illicit drugs  Current medications and supplements including opioid prescriptions. Functional ability and status Nutritional status Physical activity Advanced directives List of other physicians Hospitalizations, surgeries, and ER visits in previous 12 months Vitals Screenings to include cognitive, depression, and falls Referrals and appointments  Orders Placed This Encounter  Procedures   Ambulatory referral to Gastroenterology    Referral Priority:   Routine    Referral Type:   Consultation    Referral Reason:   Specialty Services Required    Number of Visits Requested:   1   In addition, I have reviewed and discussed with patient certain preventive protocols, quality metrics, and best practice recommendations. A written personalized care plan for preventive services as well as general preventive health recommendations were provided to patient.   Vina Ned, CMA   11/24/2024   Return 11/28/25 @ 3:50pm, for In Person [employer]  Visit.  After Visit Summary: (In Person-Printed) AVS printed and given to the patient  Nurse Notes:  Placed referral to [facility] for  colonoscopy. Last colon 10/08/23 repeat 1 yrs. Covid vaccine UTD as of 07/19/24 per patient "

## 2024-11-24 NOTE — Patient Instructions (Signed)
 Nicholas Shah,  Thank you for taking the time for your Medicare Wellness Visit. I appreciate your continued commitment to your health goals. Please review the care plan we discussed, and feel free to reach out if I can assist you further.  Please note that Annual Wellness Visits do not include a physical exam. Some assessments may be limited, especially if the visit was conducted virtually. If needed, we may recommend an in-person follow-up with your provider.  Ongoing Care Seeing your primary care provider every 3 to 6 months helps us  monitor your health and provide consistent, personalized care.   Referrals If a referral was made during today's visit and you haven't received any updates within two weeks, please contact the referred provider directly to check on the status.         Gastroenterology (GI) for colonoscopy        [facility] at [facility]         [address]         [address]         [place], [place] [address]         [phone]          Recommended Screenings: Keep up the good work!   Health Maintenance  Topic Date Due   COVID-19 Vaccine (6 - 2025-26 season) 06/20/2024   Medicare Annual Wellness Visit  09/01/2024   Colon Cancer Screening  09/06/2025*   DTaP/Tdap/Td vaccine (2 - Td or Tdap) 12/17/2027   Pneumococcal Vaccine for age over 38  Completed   Flu Shot  Completed   Hepatitis C Screening  Completed   Zoster (Shingles) Vaccine  Completed   Meningitis B Vaccine  Aged Out  *Topic was postponed. The date shown is not the original due date.       11/24/2024    4:19 PM  Advanced Directives  Does Patient Have a Medical Advance Directive? No  Would patient like information on creating a medical advance directive? No - Patient declined    Vision: Annual vision screenings are recommended for early detection of glaucoma, cataracts, and diabetic retinopathy. These exams can also reveal signs of chronic conditions such as diabetes and  high blood pressure.  Dental: Annual dental screenings help detect early signs of oral cancer, gum disease, and other conditions linked to overall health, including heart disease and diabetes.  Please see the attached documents for additional preventive care recommendations.

## 2025-03-07 ENCOUNTER — Ambulatory Visit: Admitting: Nurse Practitioner

## 2025-09-08 ENCOUNTER — Other Ambulatory Visit

## 2025-09-13 ENCOUNTER — Ambulatory Visit: Admitting: Urology

## 2025-11-28 ENCOUNTER — Ambulatory Visit
